# Patient Record
Sex: Male | Born: 1935 | Hispanic: Yes | Smoking: Never smoker
Health system: Southern US, Community
[De-identification: ages and names within clinical notes are randomized; demographics above are authoritative.]

## PROBLEM LIST (undated history)

## (undated) DIAGNOSIS — Z789 Other specified health status: Secondary | ICD-10-CM

## (undated) HISTORY — PX: NO PAST SURGERIES: SHX2092

---

## 2016-10-12 ENCOUNTER — Encounter: Payer: Self-pay | Admitting: Emergency Medicine

## 2016-10-12 ENCOUNTER — Observation Stay
Admission: EM | Admit: 2016-10-12 | Discharge: 2016-10-14 | Disposition: A | Discharge status: Home / Self Care | Payer: Medicare Other | Attending: Internal Medicine | Admitting: Internal Medicine

## 2016-10-12 ENCOUNTER — Emergency Department: Payer: Medicare Other

## 2016-10-12 DIAGNOSIS — E041 Nontoxic single thyroid nodule: Secondary | ICD-10-CM | POA: Diagnosis not present

## 2016-10-12 DIAGNOSIS — R0602 Shortness of breath: Secondary | ICD-10-CM | POA: Diagnosis not present

## 2016-10-12 DIAGNOSIS — J3802 Paralysis of vocal cords and larynx, bilateral: Principal | ICD-10-CM | POA: Insufficient documentation

## 2016-10-12 DIAGNOSIS — R079 Chest pain, unspecified: Secondary | ICD-10-CM | POA: Diagnosis not present

## 2016-10-12 DIAGNOSIS — R05 Cough: Secondary | ICD-10-CM | POA: Diagnosis not present

## 2016-10-12 HISTORY — DX: Other specified health status: Z78.9

## 2016-10-12 LAB — BASIC METABOLIC PANEL
Anion gap: 8 (ref 5–15)
BUN: 18 mg/dL (ref 6–20)
CHLORIDE: 102 mmol/L (ref 101–111)
CO2: 27 mmol/L (ref 22–32)
CREATININE: 1.31 mg/dL — AB (ref 0.61–1.24)
Calcium: 9.2 mg/dL (ref 8.9–10.3)
GFR, EST AFRICAN AMERICAN: 58 mL/min — AB (ref >60)
GFR, EST NON AFRICAN AMERICAN: 50 mL/min — AB (ref >60)
Glucose, Bld: 134 mg/dL — ABNORMAL HIGH (ref 65–99)
Potassium: 4.2 mmol/L (ref 3.5–5.1)
SODIUM: 137 mmol/L (ref 135–145)

## 2016-10-12 LAB — TROPONIN I

## 2016-10-12 LAB — CBC
HCT: 38.9 % — ABNORMAL LOW (ref 40.0–52.0)
Hemoglobin: 13.7 g/dL (ref 13.0–18.0)
MCH: 31.7 pg (ref 26.0–34.0)
MCHC: 35.2 g/dL (ref 32.0–36.0)
MCV: 90.1 fL (ref 80.0–100.0)
PLATELETS: 138 10*3/uL — AB (ref 150–440)
RBC: 4.32 MIL/uL — AB (ref 4.40–5.90)
RDW: 12.9 % (ref 11.5–14.5)
WBC: 6.5 10*3/uL (ref 3.8–10.6)

## 2016-10-12 MED ORDER — IPRATROPIUM-ALBUTEROL 0.5-2.5 (3) MG/3ML IN SOLN
RESPIRATORY_TRACT | Status: AC
  Administered 2016-10-12: 3 mL via RESPIRATORY_TRACT
  Filled 2016-10-12: qty 3

## 2016-10-12 MED ORDER — IPRATROPIUM-ALBUTEROL 0.5-2.5 (3) MG/3ML IN SOLN
3.0000 mL | Freq: Once | RESPIRATORY_TRACT | Status: AC
  Administered 2016-10-12: 3 mL via RESPIRATORY_TRACT

## 2016-10-12 MED ORDER — IPRATROPIUM-ALBUTEROL 0.5-2.5 (3) MG/3ML IN SOLN
3.0000 mL | Freq: Once | RESPIRATORY_TRACT | Status: AC
  Administered 2016-10-12: 3 mL via RESPIRATORY_TRACT
  Filled 2016-10-12: qty 3

## 2016-10-12 MED ORDER — ASPIRIN 81 MG PO CHEW
CHEWABLE_TABLET | ORAL | Status: AC
  Administered 2016-10-12: 324 mg via ORAL
  Filled 2016-10-12: qty 4

## 2016-10-12 MED ORDER — ASPIRIN 81 MG PO CHEW
324.0000 mg | CHEWABLE_TABLET | Freq: Once | ORAL | Status: AC
  Administered 2016-10-12: 324 mg via ORAL

## 2016-10-12 MED ORDER — IOPAMIDOL (ISOVUE-370) INJECTION 76%
75.0000 mL | Freq: Once | INTRAVENOUS | Status: AC | PRN
  Administered 2016-10-12: 75 mL via INTRAVENOUS

## 2016-10-12 NOTE — ED Notes (Signed)
Pt w/ another episode of coughing and stridor, dr. Fanny BienQuale to bedside, sx no subsiding

## 2016-10-12 NOTE — ED Notes (Signed)
Patient transported to X-ray informed XR to place pt back into the lobby. Interpreter accompanied patient to XR.

## 2016-10-12 NOTE — ED Triage Notes (Signed)
Pt presents to ED with grandson, recently relocated from [place]. Pt c/o left sided chest pain that started around 1600.  Pt also having some shortness of breath and dizziness as well. Pt states when the pain started he was lying down in bed watching TV.  Pt reports cough that started yesterday and is non-productive.

## 2016-10-12 NOTE — ED Notes (Signed)
Pt brought to rm 4 via WC from triage, was triaged about an hour ago for CP, now c/o severe SOB, pt w/ stridor upon arrival to room, Dr. Fanny BienQuale at bedside, RA o2sat 99% but placed on 2L [place] for comfort, w/ oxygen, pt breathing improved.  Pt c/o tightness in throat.  Assessment completed by this nurse with limited spanish ability, interpreter paged.  Per family, no medical problems and only medicine is vitamins

## 2016-10-13 ENCOUNTER — Observation Stay: Payer: Medicare Other

## 2016-10-13 ENCOUNTER — Encounter: Payer: Self-pay | Admitting: Internal Medicine

## 2016-10-13 DIAGNOSIS — J385 Laryngeal spasm: Secondary | ICD-10-CM | POA: Diagnosis not present

## 2016-10-13 DIAGNOSIS — J3802 Paralysis of vocal cords and larynx, bilateral: Secondary | ICD-10-CM | POA: Diagnosis not present

## 2016-10-13 DIAGNOSIS — R0602 Shortness of breath: Secondary | ICD-10-CM

## 2016-10-13 DIAGNOSIS — R062 Wheezing: Secondary | ICD-10-CM | POA: Diagnosis not present

## 2016-10-13 DIAGNOSIS — R079 Chest pain, unspecified: Secondary | ICD-10-CM | POA: Diagnosis not present

## 2016-10-13 DIAGNOSIS — R061 Stridor: Secondary | ICD-10-CM | POA: Diagnosis not present

## 2016-10-13 DIAGNOSIS — R930 Abnormal findings on diagnostic imaging of skull and head, not elsewhere classified: Secondary | ICD-10-CM | POA: Diagnosis not present

## 2016-10-13 LAB — BASIC METABOLIC PANEL
ANION GAP: 7 (ref 5–15)
BUN: 15 mg/dL (ref 6–20)
CALCIUM: 8.9 mg/dL (ref 8.9–10.3)
CO2: 25 mmol/L (ref 22–32)
CREATININE: 1.24 mg/dL (ref 0.61–1.24)
Chloride: 107 mmol/L (ref 101–111)
GFR calc Af Amer: >60 mL/min (ref >60)
GFR, EST NON AFRICAN AMERICAN: 53 mL/min — AB (ref >60)
GLUCOSE: 106 mg/dL — AB (ref 65–99)
Potassium: 4.2 mmol/L (ref 3.5–5.1)
Sodium: 139 mmol/L (ref 135–145)

## 2016-10-13 LAB — CBC
HCT: 38 % — ABNORMAL LOW (ref 40.0–52.0)
HEMOGLOBIN: 13.2 g/dL (ref 13.0–18.0)
MCH: 31.4 pg (ref 26.0–34.0)
MCHC: 34.8 g/dL (ref 32.0–36.0)
MCV: 90.2 fL (ref 80.0–100.0)
Platelets: 141 10*3/uL — ABNORMAL LOW (ref 150–440)
RBC: 4.22 MIL/uL — ABNORMAL LOW (ref 4.40–5.90)
RDW: 13 % (ref 11.5–14.5)
WBC: 5.9 10*3/uL (ref 3.8–10.6)

## 2016-10-13 LAB — TROPONIN I
Troponin I: <0.03 ng/mL (ref <0.03)
Troponin I: <0.03 ng/mL (ref <0.03)

## 2016-10-13 LAB — SEDIMENTATION RATE: Sed Rate: 26 mm/hr — ABNORMAL HIGH (ref 0–20)

## 2016-10-13 MED ORDER — GADOBENATE DIMEGLUMINE 529 MG/ML IV SOLN
13.0000 mL | Freq: Once | INTRAVENOUS | Status: AC | PRN
  Administered 2016-10-13: 13 mL via INTRAVENOUS

## 2016-10-13 MED ORDER — ONDANSETRON HCL 4 MG PO TABS: 4.0000 mg | ORAL_TABLET | Freq: Four times a day (QID) | ORAL | Status: DC | PRN

## 2016-10-13 MED ORDER — CEFUROXIME AXETIL 500 MG PO TABS
500.0000 mg | ORAL_TABLET | Freq: Two times a day (BID) | ORAL | Status: DC
  Administered 2016-10-13 – 2016-10-14 (×2): 500 mg via ORAL
  Filled 2016-10-13 (×2): qty 1

## 2016-10-13 MED ORDER — DEXAMETHASONE SODIUM PHOSPHATE 4 MG/ML IJ SOLN
4.0000 mg | Freq: Three times a day (TID) | INTRAMUSCULAR | Status: DC
  Administered 2016-10-13 – 2016-10-14 (×4): 4 mg via INTRAVENOUS
  Filled 2016-10-13 (×4): qty 1

## 2016-10-13 MED ORDER — PANTOPRAZOLE SODIUM 40 MG PO TBEC
40.0000 mg | DELAYED_RELEASE_TABLET | Freq: Two times a day (BID) | ORAL | Status: DC
  Administered 2016-10-13 – 2016-10-14 (×2): 40 mg via ORAL
  Filled 2016-10-13 (×2): qty 1

## 2016-10-13 MED ORDER — IPRATROPIUM-ALBUTEROL 0.5-2.5 (3) MG/3ML IN SOLN
3.0000 mL | RESPIRATORY_TRACT | Status: DC
  Administered 2016-10-13 (×4): 3 mL via RESPIRATORY_TRACT
  Filled 2016-10-13 (×4): qty 3

## 2016-10-13 MED ORDER — IPRATROPIUM-ALBUTEROL 0.5-2.5 (3) MG/3ML IN SOLN: 3.0000 mL | RESPIRATORY_TRACT | Status: DC | PRN

## 2016-10-13 MED ORDER — SODIUM CHLORIDE 0.9 % IV SOLN
INTRAVENOUS | Status: AC
  Administered 2016-10-13: 02:00:00 via INTRAVENOUS

## 2016-10-13 MED ORDER — ACETAMINOPHEN 650 MG RE SUPP: 650.0000 mg | Freq: Four times a day (QID) | RECTAL | Status: DC | PRN

## 2016-10-13 MED ORDER — ASPIRIN 81 MG PO CHEW
81.0000 mg | CHEWABLE_TABLET | Freq: Every day | ORAL | Status: DC
  Administered 2016-10-13 – 2016-10-14 (×2): 81 mg via ORAL
  Filled 2016-10-13 (×2): qty 1

## 2016-10-13 MED ORDER — ACETAMINOPHEN 325 MG PO TABS: 650.0000 mg | ORAL_TABLET | Freq: Four times a day (QID) | ORAL | Status: DC | PRN

## 2016-10-13 MED ORDER — TUBERCULIN PPD 5 UNIT/0.1ML ID SOLN
5.0000 [IU] | Freq: Once | INTRADERMAL | Status: DC
  Administered 2016-10-14: 5 [IU] via INTRADERMAL
  Filled 2016-10-13 (×2): qty 0.1

## 2016-10-13 MED ORDER — ONDANSETRON HCL 4 MG/2ML IJ SOLN: 4.0000 mg | Freq: Four times a day (QID) | INTRAMUSCULAR | Status: DC | PRN

## 2016-10-13 MED ORDER — ENOXAPARIN SODIUM 40 MG/0.4ML ~~LOC~~ SOLN
40.0000 mg | SUBCUTANEOUS | Status: DC
  Administered 2016-10-13: 40 mg via SUBCUTANEOUS
  Filled 2016-10-13: qty 0.4

## 2016-10-13 NOTE — Progress Notes (Signed)
The RN called to the room the second time by the family and noted stridor sound and coughing again.  Also noted thick yellow sputum from the patient . Dr. Sheryle Hailiamond called to come to the bedside and check the patient. MD stated he would be here soon. Reported the  The thick sputum to Dr. Sheryle Hailiamond as well. Will continue to monitor.

## 2016-10-13 NOTE — H&P (Signed)
[facility] [place] at [facility]   PATIENT NAME: Walter Marshall    MR#:  [id number]  DATE OF BIRTH:  10/07/1934  DATE OF ADMISSION:  10/12/2016  PRIMARY CARE PHYSICIAN: No PCP Per Patient   REQUESTING/REFERRING PHYSICIAN: Fanny Bien, MD  CHIEF COMPLAINT:   Chief Complaint  Patient presents with  . Chest Pain    HISTORY OF PRESENT ILLNESS:  Walter Marshall  is a 82 y.o. male who presents with Chest pain. Patient states that he has had several episodes of chest pain today. He describes these as a sudden onset type of pain, he states that he feels like he has a squeezing sensation in his throat which then moves into his chest. He has no prior diagnosed history of medical problems and has not ever followed regularly with a doctor. In the ED tonight he had 2 sets of cardiac enzymes are negative, however he had some flattened T waves in his septal leads on his first EKG which then became significantly inverted his second EKG during one of these episodes. Hospitalists were called for admission and further evaluation.  PAST MEDICAL HISTORY:   Past Medical History:  Diagnosis Date  . Patient denies medical problems     PAST SURGICAL HISTORY:   Past Surgical History:  Procedure Laterality Date  . NO PAST SURGERIES      SOCIAL HISTORY:   Social History  Substance Use Topics  . Smoking status: Never Smoker  . Smokeless tobacco: Never Used  . Alcohol use Yes    FAMILY HISTORY:   Family History  Problem Relation Age of Onset  . Family history unknown: Yes    DRUG ALLERGIES:  No Known Allergies  MEDICATIONS AT HOME:   Prior to Admission medications   Not on File    REVIEW OF SYSTEMS:  Review of Systems  Constitutional: Negative for chills, fever, malaise/fatigue and weight loss.  HENT: Negative for ear pain, hearing loss and tinnitus.   Eyes: Negative for blurred vision, double vision, pain and redness.  Respiratory: Positive  for cough. Negative for hemoptysis and shortness of breath.   Cardiovascular: Positive for chest pain. Negative for palpitations, orthopnea and leg swelling.  Gastrointestinal: Negative for abdominal pain, constipation, diarrhea, nausea and vomiting.  Genitourinary: Negative for dysuria, frequency and hematuria.  Musculoskeletal: Negative for back pain, joint pain and neck pain.  Skin:       No acne, rash, or lesions  Neurological: Negative for dizziness, tremors, focal weakness and weakness.  Endo/Heme/Allergies: Negative for polydipsia. Does not bruise/bleed easily.  Psychiatric/Behavioral: Negative for depression. The patient is not nervous/anxious and does not have insomnia.      VITAL SIGNS:   Vitals:   10/12/16 2230 10/12/16 2300 10/12/16 2330 10/13/16 0000  BP: 121/73 (!) 151/66 (!) 162/76 136/78  Pulse: 79 89 (!) 105 98  Resp: 20 19 15 13   Temp:      TempSrc:      SpO2: 97% 98% 96% 95%  Weight:      Height:       Wt Readings from Last 3 Encounters:  10/12/16 63.5 kg (140 lb)    PHYSICAL EXAMINATION:  Physical Exam  Vitals reviewed. Constitutional: He is oriented to person, place, and time. He appears well-developed and well-nourished. No distress.  HENT:  Head: Normocephalic and atraumatic.  Mouth/Throat: Oropharynx is clear and moist.  Eyes: Conjunctivae and EOM are normal. Pupils are equal, round, and reactive to light. No scleral icterus.  Neck: Normal range of motion. Neck supple. No JVD present. No thyromegaly present.  Cardiovascular: Normal rate, regular rhythm and intact distal pulses.  Exam reveals no gallop and no friction rub.   No murmur heard. Respiratory: Effort normal and breath sounds normal. No respiratory distress. He has no wheezes. He has no rales.  GI: Soft. Bowel sounds are normal. He exhibits no distension. There is no tenderness.  Musculoskeletal: Normal range of motion. He exhibits no edema.  No arthritis, no gout  Lymphadenopathy:    He  has no cervical adenopathy.  Neurological: He is alert and oriented to person, place, and time. No cranial nerve deficit.  No dysarthria, no aphasia  Skin: Skin is warm and dry. No rash noted. No erythema.  Psychiatric: He has a normal mood and affect. His behavior is normal. Judgment and thought content normal.    LABORATORY PANEL:   CBC  Recent Labs Lab 10/12/16 1820  WBC 6.5  HGB 13.7  HCT 38.9*  PLT 138*   ------------------------------------------------------------------------------------------------------------------  Chemistries   Recent Labs Lab 10/12/16 1820  NA 137  K 4.2  CL 102  CO2 27  GLUCOSE 134*  BUN 18  CREATININE 1.31*  CALCIUM 9.2   ------------------------------------------------------------------------------------------------------------------  Cardiac Enzymes  Recent Labs Lab 10/12/16 2228  TROPONINI <0.03   ------------------------------------------------------------------------------------------------------------------  RADIOLOGY:  Dg Chest 2 View  Result Date: 10/12/2016 CLINICAL DATA:  Chest pain, shortness of breath and cough today EXAM: CHEST  2 VIEW COMPARISON:  None FINDINGS: Mild enlargement of cardiac silhouette. Mediastinal contours and pulmonary vascularity normal. Lungs clear. No pleural effusion or pneumothorax. Scattered endplate spur formation thoracic spine. IMPRESSION: No acute abnormalities. Mild enlargement of cardiac silhouette. Electronically Signed   By: Ulyses SouthwardMark  Boles M.D.   On: 10/12/2016 18:52   Ct Angio Chest Pe W Or Wo Contrast  Result Date: 10/12/2016 CLINICAL DATA:  Left-sided chest pain for several hours EXAM: CT ANGIOGRAPHY CHEST WITH CONTRAST TECHNIQUE: Multidetector CT imaging of the chest was performed using the standard protocol during bolus administration of intravenous contrast. Multiplanar CT image reconstructions and MIPs were obtained to evaluate the vascular anatomy. CONTRAST:  75 mL Isovue 370.  COMPARISON:  None. FINDINGS: Cardiovascular: The thoracic aorta shows no significant aneurysmal dilatation or dissection. Cardiac structures are within normal limits. Pulmonary artery demonstrates a normal branching pattern. No filling defects are identified to suggest pulmonary emboli. Mediastinum/Nodes: The thoracic inlet shows evidence of a large 3.2 cm hypodense nodule within the left lobe of the thyroid extending into the superior mediastinum. No significant lymphadenopathy is identified. Some scattered calcified lymph nodes are noted within the hila consistent with prior granulomatous disease. Lungs/Pleura: Lungs are well aerated bilaterally. Scattered calcifications are seen. This is consistent with prior granulomatous disease. No focal infiltrate or sizable effusion is seen. Upper Abdomen: No acute abnormality. Musculoskeletal: Degenerative change of the thoracic spine is noted. Review of the MIP images confirms the above findings. IMPRESSION: No evidence of pulmonary emboli. Changes of prior granulomatous disease. 3.2 cm left thyroid nodule. The need for further evaluation by a nonemergent ultrasound can be determined on a clinical basis. Electronically Signed   By: Alcide CleverMark  Lukens M.D.   On: 10/12/2016 20:07    EKG:   Orders placed or performed during the hospital encounter of 10/12/16  . EKG 12-Lead  . EKG 12-Lead  . ED EKG within 10 minutes  . ED EKG within 10 minutes  . ED EKG  . ED EKG  . EKG 12-Lead  .  EKG 12-Lead    IMPRESSION AND PLAN:  Principal Problem:   Chest pain - unclear etiology, troponins negative so far but he did have some EKG changes from his first to second EKG. We will finish trending his enzymes tonight, get an echocardiogram and cardiology consult.  All the records are reviewed and case discussed with ED provider. Management plans discussed with the patient and/or family.  DVT PROPHYLAXIS: SubQ lovenox  GI PROPHYLAXIS: None  ADMISSION STATUS:  Observation  CODE STATUS: Full Code Status History    This patient does not have a recorded code status. Please follow your organizational policy for patients in this situation.      TOTAL TIME TAKING CARE OF THIS PATIENT: 40 minutes.    Jazzy Parmer FIELDING 10/13/2016, 12:33 AM  [facility]  Office  [phone]  CC: Primary care physician; No PCP Per Patient

## 2016-10-13 NOTE — Progress Notes (Signed)
[facility] - [place] at [facility]   PATIENT NAME: Walter Marshall    MR#:  [id number]  DATE OF BIRTH:  [birth year]  SUBJECTIVE:   Patient here with chest pain however says he has had episodes of choking sensation and breathing issues with this with notable stridor that then gives him chest pain  REVIEW OF SYSTEMS:    Review of Systems  Constitutional: Negative.  Negative for chills, fever and malaise/fatigue.  HENT: Negative.  Negative for ear discharge, ear pain, hearing loss, nosebleeds and sore throat.        Stridor episodes 4 since admission   Eyes: Negative.  Negative for blurred vision and pain.  Respiratory: Negative.  Negative for cough, hemoptysis, shortness of breath and wheezing.   Cardiovascular: Positive for chest pain (with strodir and difficulty breathing). Negative for palpitations and leg swelling.  Gastrointestinal: Negative.  Negative for abdominal pain, blood in stool, diarrhea, nausea and vomiting.  Genitourinary: Negative.  Negative for dysuria.  Musculoskeletal: Negative.  Negative for back pain.  Skin: Negative.   Neurological: Negative for dizziness, tremors, speech change, focal weakness, seizures and headaches.  Endo/Heme/Allergies: Negative.  Does not bruise/bleed easily.  Psychiatric/Behavioral: Negative.  Negative for depression, hallucinations and suicidal ideas.    Tolerating Diet: NPO      DRUG ALLERGIES:  No Known Allergies  VITALS:  Blood pressure 107/68, pulse 92, temperature 97.9 F (36.6 C), temperature source Oral, resp. rate 18, height 5' (1.524 m), weight 67.8 kg (149 lb 8 oz), SpO2 98 %.  PHYSICAL EXAMINATION:   Physical Exam  Constitutional: He is oriented to person, place, and time and well-developed, well-nourished, and in no distress. No distress.  HENT:  Head: Normocephalic.  Eyes: No scleral icterus.  Neck: Normal range of motion. Neck supple. No JVD present. No tracheal deviation present.   Cardiovascular: Normal rate, regular rhythm and normal heart sounds.  Exam reveals no gallop and no friction rub.   No murmur heard. Pulmonary/Chest: Effort normal and breath sounds normal. No respiratory distress. He has no wheezes. He has no rales. He exhibits no tenderness.  Abdominal: Soft. Bowel sounds are normal. He exhibits no distension and no mass. There is no tenderness. There is no rebound and no guarding.  Musculoskeletal: Normal range of motion. He exhibits no edema.  Neurological: He is alert and oriented to person, place, and time.  Skin: Skin is warm. No rash noted. No erythema.  Psychiatric: Affect and judgment normal.      LABORATORY PANEL:   CBC  Recent Labs Lab 10/13/16 0445  WBC 5.9  HGB 13.2  HCT 38.0*  PLT 141*   ------------------------------------------------------------------------------------------------------------------  Chemistries   Recent Labs Lab 10/13/16 0445  NA 139  K 4.2  CL 107  CO2 25  GLUCOSE 106*  BUN 15  CREATININE 1.24  CALCIUM 8.9   ------------------------------------------------------------------------------------------------------------------  Cardiac Enzymes  Recent Labs Lab 10/12/16 1820 10/12/16 2228 10/13/16 0445  TROPONINI <0.03 <0.03 <0.03   ------------------------------------------------------------------------------------------------------------------  RADIOLOGY:  Dg Chest 2 View  Result Date: 10/12/2016 CLINICAL DATA:  Chest pain, shortness of breath and cough today EXAM: CHEST  2 VIEW COMPARISON:  None FINDINGS: Mild enlargement of cardiac silhouette. Mediastinal contours and pulmonary vascularity normal. Lungs clear. No pleural effusion or pneumothorax. Scattered endplate spur formation thoracic spine. IMPRESSION: No acute abnormalities. Mild enlargement of cardiac silhouette. Electronically Signed   By: Ulyses SouthwardMark  Boles M.D.   On: 10/12/2016 18:52   Ct Angio Chest Pe W Or  Wo Contrast  Result Date:  10/12/2016 CLINICAL DATA:  Left-sided chest pain for several hours EXAM: CT ANGIOGRAPHY CHEST WITH CONTRAST TECHNIQUE: Multidetector CT imaging of the chest was performed using the standard protocol during bolus administration of intravenous contrast. Multiplanar CT image reconstructions and MIPs were obtained to evaluate the vascular anatomy. CONTRAST:  75 mL Isovue 370. COMPARISON:  None. FINDINGS: Cardiovascular: The thoracic aorta shows no significant aneurysmal dilatation or dissection. Cardiac structures are within normal limits. Pulmonary artery demonstrates a normal branching pattern. No filling defects are identified to suggest pulmonary emboli. Mediastinum/Nodes: The thoracic inlet shows evidence of a large 3.2 cm hypodense nodule within the left lobe of the thyroid extending into the superior mediastinum. No significant lymphadenopathy is identified. Some scattered calcified lymph nodes are noted within the hila consistent with prior granulomatous disease. Lungs/Pleura: Lungs are well aerated bilaterally. Scattered calcifications are seen. This is consistent with prior granulomatous disease. No focal infiltrate or sizable effusion is seen. Upper Abdomen: No acute abnormality. Musculoskeletal: Degenerative change of the thoracic spine is noted. Review of the MIP images confirms the above findings. IMPRESSION: No evidence of pulmonary emboli. Changes of prior granulomatous disease. 3.2 cm left thyroid nodule. The need for further evaluation by a nonemergent ultrasound can be determined on a clinical basis. Electronically Signed   By: Alcide CleverMark  Lukens M.D.   On: 10/12/2016 20:07     ASSESSMENT AND PLAN:   82 y/o male with no PMHX here with atypical chest pain and stridor.  1. Chest pain and ruled for for XBJ:YNWGACS:This happens with stridor/wheezing. ENT evaluation in addition to cardiology Consider outpatient stress test with abnormal EKG during episode CT shows no PE  2. Stridor: ENT evaluation  3.3.2  cm left nodule, thyroid: Outpatient follow up with ENDOCRINE   Management plans discussed with the patient and he is in agreement.  CODE STATUS: full  TOTAL TIME TAKING CARE OF THIS PATIENT: 33 minutes.   Rounded with nursing  POSSIBLE D/C 1-2 days, DEPENDING ON CLINICAL CONDITION.   Mayrene Bastarache M.D on 10/13/2016 at 7:36 AM  Between 7am to 6pm - Pager - [phone] After 6pm go to [email or web address] - Social research officer, [email or web address] [email or web address]  [facility]  Office  [phone]  CC: Primary care physician; No PCP Per Patient  Note: This dictation was prepared with Dragon dictation along with smaller phrase technology. Any transcriptional errors that result from this process are unintentional.

## 2016-10-13 NOTE — Consult Note (Addendum)
Walter Marshall, Walter Marshall [id number] 10-21-1934 Riley Nearing, MD  Reason for Consult: Stridor Requesting Physician: Bettey Costa, MD Consulting Physician: Riley Nearing  HPI: This 82 y.o. year old male was admitted on 10/12/2016 for Chest pain [R07.9] Moderate risk chest pain [R07.9]. This patient was admitted to the hospital yesterday after an episode of chest pain and tightness in his throat with difficulty breathing. He has not had issues like this previously. He has had some sore throat since this started but no fever and denies any history of reflux or difficulty swallowing. He has had some hoarseness since this started. There is no history of prior intubation reported and he is otherwise reported as healthy with no known medical history. He is a nonsmoker though he admits to heavy alcohol use in the past, but does not drink at this point. He does say that he coughed up some thick mucus with some blood tinge to it. CT scan of the chest showed a 3.2 cm left thyroid nodule. The patient was unaware of any thyroid problems. He also had some small calcified nodes in the hilum suggestive of prior granulomatous disease. Reviewing the scan I also note that the true vocal cords appear to be near midline.  Allergies: No Known Allergies  Medications:  No prescriptions prior to admission.  .  Current Facility-Administered Medications  Medication Dose Route Frequency Provider Last Rate Last Dose  . acetaminophen (TYLENOL) tablet 650 mg  650 mg Oral Q6H PRN Lance Coon, MD       Or  . acetaminophen (TYLENOL) suppository 650 mg  650 mg Rectal Q6H PRN Lance Coon, MD      . aspirin chewable tablet 81 mg  81 mg Oral Daily Bettey Costa, MD   81 mg at 10/13/16 1037  . enoxaparin (LOVENOX) injection 40 mg  40 mg Subcutaneous Q24H Lance Coon, MD      . ipratropium-albuterol (DUONEB) 0.5-2.5 (3) MG/3ML nebulizer solution 3 mL  3 mL Nebulization Q4H Harrie Foreman, MD   3 mL at 10/13/16 1107  . ondansetron  (ZOFRAN) tablet 4 mg  4 mg Oral Q6H PRN Lance Coon, MD       Or  . ondansetron [facility]) injection 4 mg  4 mg Intravenous Q6H PRN Lance Coon, MD        PMH:  Past Medical History:  Diagnosis Date  . Patient denies medical problems     Fam Hx:  Family History  Problem Relation Age of Onset  . Family history unknown: Yes    Soc Hx:  Social History   Social History  . Marital status: Widowed    Spouse name: N/A  . Number of children: N/A  . Years of education: N/A   Occupational History  . Not on file.   Social History Main Topics  . Smoking status: Never Smoker  . Smokeless tobacco: Never Used  . Alcohol use Yes  . Drug use: No  . Sexual activity: Not on file   Other Topics Concern  . Not on file   Social History Narrative  . No narrative on file    PSH:  Past Surgical History:  Procedure Laterality Date  . NO PAST SURGERIES    . Procedures since admission: No admission procedures for hospital encounter.  ROS: Review of systems normal other than 12 systems except per HPI.  PHYSICAL EXAM Vitals:  Vitals:   10/13/16 0847 10/13/16 1223  BP: 139/68 (!) 115/59  Pulse: 100 93  Resp:  Temp: 98.7 F (37.1 C) 98.9 F (37.2 C)  . General: Well-developed, Well-nourished in no acute distress Mood: Mood and affect well adjusted, pleasant and cooperative. Orientation: Grossly alert and oriented. Vocal Quality: No hoarseness. Communicates verbally.Communicating through interpreter. head and Face: NCAT. No facial asymmetry. No visible skin lesions. No significant facial scars. No tenderness with sinus percussion. Facial strength normal and symmetric. Ears: External ears with normal landmarks, no lesions. External auditory canals free of infection, cerumen impaction or lesions. Tympanic membranes intact with good landmarks and normal mobility on pneumatic otoscopy. No middle ear effusion. Hearing: Speech reception grossly normal. Nose: External nose normal with  midline dorsum and no lesions or deformity. Nasal Cavity reveals essentially midline septum with normal inferior turbinates. No significant mucosal congestion or erythema. Nasal secretions are minimal and clear. No polyps seen on anterior rhinoscopy. Oral Cavity/ Oropharynx: Lips are normal with no lesions. Gingiva healthy with no lesions or gingivitis. Oropharynx including tongue, buccal mucosa, floor of mouth, hard and soft palate, uvula and posterior pharynx free of exudates, erythema or lesions with normal symmetry and hydration.  Indirect Laryngoscopy/Nasopharyngoscopy: Visualization of the larynx, hypopharynx and nasopharynx is not possible in this setting with routine examination. Neck: Supple and symmetric with no palpable masses, tenderness or crepitance. The trachea is midline. Thyroid gland free of palpable masses. I cannot palpate the left thyroid nodule. Parotid and submandibular glands are soft, nontender and symmetric, without masses. Lymphatic: Cervical lymph nodes are without palpable lymphadenopathy or tenderness. Respiratory: Normal respiratory effort without labored breathing. No stridor currently. Cardiovascular: Carotid pulse shows regular rate and rhythm Neurologic: Cranial Nerves II through XII are grossly intact. Eyes: Gaze and Ocular Motility are grossly normal. PERRLA. No visible nystagmus.  MEDICAL DECISION MAKING: Data Review:  Results for orders placed or performed during the hospital encounter of 10/12/16 (from the past 48 hour(s))  Basic metabolic panel     Status: Abnormal   Collection Time: 10/12/16  6:20 PM  Result Value Ref Range   Sodium 137 135 - 145 mmol/L   Potassium 4.2 3.5 - 5.1 mmol/L   Chloride 102 101 - 111 mmol/L   CO2 27 22 - 32 mmol/L   Glucose, Bld 134 (H) 65 - 99 mg/dL   BUN 18 6 - 20 mg/dL   Creatinine, Ser 1.31 (H) 0.61 - 1.24 mg/dL   Calcium 9.2 8.9 - 10.3 mg/dL   GFR calc non Af Amer 50 (L) >60 mL/min   GFR calc Af Amer 58 (L) >60 mL/min     Comment: (NOTE) The eGFR has been calculated using the CKD EPI equation. This calculation has not been validated in all clinical situations. eGFR's persistently <60 mL/min signify possible Chronic Kidney Disease.    Anion gap 8 5 - 15  CBC     Status: Abnormal   Collection Time: 10/12/16  6:20 PM  Result Value Ref Range   WBC 6.5 3.8 - 10.6 K/uL   RBC 4.32 (L) 4.40 - 5.90 MIL/uL   Hemoglobin 13.7 13.0 - 18.0 g/dL   HCT 38.9 (L) 40.0 - 52.0 %   MCV 90.1 80.0 - 100.0 fL   MCH 31.7 26.0 - 34.0 pg   MCHC 35.2 32.0 - 36.0 g/dL   RDW 12.9 11.5 - 14.5 %   Platelets 138 (L) 150 - 440 K/uL  Troponin I     Status: None   Collection Time: 10/12/16  6:20 PM  Result Value Ref Range   Troponin I <0.03 <0.03 ng/mL  Troponin I     Status: None   Collection Time: 10/12/16 10:28 PM  Result Value Ref Range   Troponin I <0.03 <0.03 ng/mL  Basic metabolic panel     Status: Abnormal   Collection Time: 10/13/16  4:45 AM  Result Value Ref Range   Sodium 139 135 - 145 mmol/L   Potassium 4.2 3.5 - 5.1 mmol/L   Chloride 107 101 - 111 mmol/L   CO2 25 22 - 32 mmol/L   Glucose, Bld 106 (H) 65 - 99 mg/dL   BUN 15 6 - 20 mg/dL   Creatinine, Ser 1.24 0.61 - 1.24 mg/dL   Calcium 8.9 8.9 - 10.3 mg/dL   GFR calc non Af Amer 53 (L) >60 mL/min   GFR calc Af Amer >60 >60 mL/min    Comment: (NOTE) The eGFR has been calculated using the CKD EPI equation. This calculation has not been validated in all clinical situations. eGFR's persistently <60 mL/min signify possible Chronic Kidney Disease.    Anion gap 7 5 - 15  CBC     Status: Abnormal   Collection Time: 10/13/16  4:45 AM  Result Value Ref Range   WBC 5.9 3.8 - 10.6 K/uL   RBC 4.22 (L) 4.40 - 5.90 MIL/uL   Hemoglobin 13.2 13.0 - 18.0 g/dL   HCT 38.0 (L) 40.0 - 52.0 %   MCV 90.2 80.0 - 100.0 fL   MCH 31.4 26.0 - 34.0 pg   MCHC 34.8 32.0 - 36.0 g/dL   RDW 13.0 11.5 - 14.5 %   Platelets 141 (L) 150 - 440 K/uL  Troponin I     Status: None    Collection Time: 10/13/16  4:45 AM  Result Value Ref Range   Troponin I <0.03 <0.03 ng/mL  Troponin I     Status: None   Collection Time: 10/13/16 10:13 AM  Result Value Ref Range   Troponin I <0.03 <0.03 ng/mL  . Dg Chest 2 View  Result Date: 10/12/2016 CLINICAL DATA:  Chest pain, shortness of breath and cough today EXAM: CHEST  2 VIEW COMPARISON:  None FINDINGS: Mild enlargement of cardiac silhouette. Mediastinal contours and pulmonary vascularity normal. Lungs clear. No pleural effusion or pneumothorax. Scattered endplate spur formation thoracic spine. IMPRESSION: No acute abnormalities. Mild enlargement of cardiac silhouette. Electronically Signed   By: Lavonia Dana M.D.   On: 10/12/2016 18:52   Ct Angio Chest Pe W Or Wo Contrast  Result Date: 10/12/2016 CLINICAL DATA:  Left-sided chest pain for several hours EXAM: CT ANGIOGRAPHY CHEST WITH CONTRAST TECHNIQUE: Multidetector CT imaging of the chest was performed using the standard protocol during bolus administration of intravenous contrast. Multiplanar CT image reconstructions and MIPs were obtained to evaluate the vascular anatomy. CONTRAST:  75 mL Isovue 370. COMPARISON:  None. FINDINGS: Cardiovascular: The thoracic aorta shows no significant aneurysmal dilatation or dissection. Cardiac structures are within normal limits. Pulmonary artery demonstrates a normal branching pattern. No filling defects are identified to suggest pulmonary emboli. Mediastinum/Nodes: The thoracic inlet shows evidence of a large 3.2 cm hypodense nodule within the left lobe of the thyroid extending into the superior mediastinum. No significant lymphadenopathy is identified. Some scattered calcified lymph nodes are noted within the hila consistent with prior granulomatous disease. Lungs/Pleura: Lungs are well aerated bilaterally. Scattered calcifications are seen. This is consistent with prior granulomatous disease. No focal infiltrate or sizable effusion is seen. Upper  Abdomen: No acute abnormality. Musculoskeletal: Degenerative change of the thoracic spine is noted.  Review of the MIP images confirms the above findings. IMPRESSION: No evidence of pulmonary emboli. Changes of prior granulomatous disease. 3.2 cm left thyroid nodule. The need for further evaluation by a nonemergent ultrasound can be determined on a clinical basis. Electronically Signed   By: Inez Catalina M.D.   On: 10/12/2016 20:07  .   PROCEDURE: Procedure: Diagnostic Fiberoptic Nasolaryngoscopy Diagnosis: Recent stridor. Bilateral true vocal cord paresis Indications: Patient with recent stridor Findings: The vocal cords are mildly erythematous with some edema and showed no significant mobility. They are in the paramedian position. I cannot appreciate any subglottic mass, the visualization is limited. There are no masses or mucosal lesions of the larynx itself. There were some thick yellow secretions noted in the posterior nasal cavity, which could suggest mild sinusitis Description of Procedure: After discussing procedure and risks  (primarily nose bleed) with the patient, the nose was anesthetized with topical Lidocaine 4% and decongested with phenylephrine. A flexible fiberoptic scope was passed through the nasal cavity. The nasal cavity was inspected and the scope passed through the Nasopharynx to the region of the hypopharynx and larynx. The patient was instructed to phonate to assess vocal cord mobility. The tongue was extended to evaluate the tongue base completely. Valsalva was performed to insufflate the hypopharynx for improved examination. Findings are as noted above. The scope was withdrawn. The patient tolerated the procedure well.  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 He does have a left thyroid nodule but this would not explain paresis of the right vocal cord.  PLAN: Would recommend an MRI of the brain with contrast to rule out central  causes of bilateral vocal cord paresis. Serology should be obtained to evaluate for sarcoidosis, Wegener's granulomatosis, rheumatoid arthritis and lupus. Given the possibility of granulomatous change noted in the hilar nodes, it would also be reasonable to place a PPD to rule out tubercular disease. Some of the edema noted could be secondary to reflux, so would maximize coverage for this. He also does need an ultrasound guided FNA of the left thyroid nodule, and thyroid functions assessed. Since he has had some colored sputum, coverage for sinusitis is reasonable given the findings on nasal endoscopy.  The referral mentioned ruling out Zenker's diverticulum. The patient has not had any ongoing swallowing difficulties. Barium swallow could be considered but clearly the difficulty breathing he has had is due to vocal cord paralysis.  Currently the patient is breathing comfortably at rest despite mild vocal cord edema and bilateral cord paresis. If his condition were to deteriorate he could be intubated. Long-term, bilateral cord paralysis, if unlikely to recover, can be managed with surgical arytenoidectomy on one side that this compromises vocal quality. If this were considered I would refer him to [facility] for such a procedure. Tracheostomy can be necessary in cases where patient's have ongoing difficulties breathing, however he appears to be breathing comfortably currently in this would not appear to be necessary, especially if we reduce some of the mild edema noted with steroids and reflux coverage.   Riley Nearing, MD 10/13/2016 1:42 PM

## 2016-10-13 NOTE — Care Management Obs Status (Signed)
MEDICARE OBSERVATION STATUS NOTIFICATION   Patient Details  Name: Walter Marshall MRN: [id number] Date of Birth: 08/22/1934   Medicare Observation Status Notification Given:  Yes    Theoren Palka, Derrill MemoGenese M, RN 10/13/2016, 7:25 PM

## 2016-10-13 NOTE — Progress Notes (Signed)
Dr. Sheryle Hailiamond saw patient at bedside and stated he was going to order laryngoscopy. Dr. Sheryle Hailiamond spoke Spanish to the patient and he understood everything. Patient indicated whenever those episodes happen, it cut his airway or closes it.

## 2016-10-13 NOTE — Consult Note (Signed)
Primary Physician: Primary Cardiologist:  Asked to see re CP    HPI: Pt is an 82 yo who presented with CP  CP started yesterdaiy  Sudden squeezing Talking to patient and family he was ok until Thursday  He was in kitchen when he developed sudden SOB  Couldn't get breath in or out  [place] outside  Eased.  He had a few other episodes since  One family member witnessed said it looked like he was choking , like had to perform heimlich  Couldn't get breath in or out. He has noticed some cough with sputum production over the past few days  Sl bloody  Prior to Thursday felt good  No CP  No SOB  Actvie         Past Medical History:  Diagnosis Date  . Patient denies medical problems     No prescriptions prior to admission.     Marland Kitchen aspirin  81 mg Oral Daily  . enoxaparin (LOVENOX) injection  40 mg Subcutaneous Q24H  . ipratropium-albuterol  3 mL Nebulization Q4H    Infusions: . sodium chloride 75 mL/hr at 10/13/16 0224    No Known Allergies  Social History   Social History  . Marital status: Widowed    Spouse name: N/A  . Number of children: N/A  . Years of education: N/A   Occupational History  . Not on file.   Social History Main Topics  . Smoking status: Never Smoker  . Smokeless tobacco: Never Used  . Alcohol use Yes  . Drug use: No  . Sexual activity: Not on file   Other Topics Concern  . Not on file   Social History Narrative  . No narrative on file    Family History  Problem Relation Age of Onset  . Family history unknown: Yes    REVIEW OF SYSTEMS:  All systems reviewed  Negative to the above problem except as noted above.    PHYSICAL EXAM: Vitals:   10/13/16 0605 10/13/16 0847  BP: 107/68 139/68  Pulse: 92 100  Resp: 18   Temp: 97.9 F (36.6 C) 98.7 F (37.1 C)     Intake/Output Summary (Last 24 hours) at 10/13/16 0912 Last data filed at 10/13/16 0657  Gross per 24 hour  Intake           341.25 ml  Output              650 ml  Net           -308.75 ml    General:  Well appearing. No respiratory difficulty HEENT: normal Neck: supple. no JVD. Carotids 2+ bilat; no bruits. No lymphadenopathy or thryomegaly appreciated. Cor: PMI nondisplaced. Regular rate & rhythm. No rubs, gallops or murmurs. Lungs: Rhonchi R Lung Abdomen: soft, nontender, nondistended. No hepatosplenomegaly. No bruits or masses. Good bowel sounds. Extremities: no cyanosis, clubbing, rash, edema Neuro: alert & oriented x 3, cranial nerves grossly intact. moves all 4 extremities w/o difficulty. Affect pleasant.  ECG:  NSR  Nonspecific ST changes  Results for orders placed or performed during the hospital encounter of 10/12/16 (from the past 24 hour(s))  Basic metabolic panel     Status: Abnormal   Collection Time: 10/12/16  6:20 PM  Result Value Ref Range   Sodium 137 135 - 145 mmol/L   Potassium 4.2 3.5 - 5.1 mmol/L   Chloride 102 101 - 111 mmol/L   CO2 27 22 - 32 mmol/L   Glucose,  Bld 134 (H) 65 - 99 mg/dL   BUN 18 6 - 20 mg/dL   Creatinine, Ser 4.541.31 (H) 0.61 - 1.24 mg/dL   Calcium 9.2 8.9 - 09.810.3 mg/dL   GFR calc non Af Amer 50 (L) >60 mL/min   GFR calc Af Amer 58 (L) >60 mL/min   Anion gap 8 5 - 15  CBC     Status: Abnormal   Collection Time: 10/12/16  6:20 PM  Result Value Ref Range   WBC 6.5 3.8 - 10.6 K/uL   RBC 4.32 (L) 4.40 - 5.90 MIL/uL   Hemoglobin 13.7 13.0 - 18.0 g/dL   HCT 11.938.9 (L) 14.740.0 - 82.952.0 %   MCV 90.1 80.0 - 100.0 fL   MCH 31.7 26.0 - 34.0 pg   MCHC 35.2 32.0 - 36.0 g/dL   RDW 56.212.9 13.011.5 - 86.514.5 %   Platelets 138 (L) 150 - 440 K/uL  Troponin I     Status: None   Collection Time: 10/12/16  6:20 PM  Result Value Ref Range   Troponin I <0.03 <0.03 ng/mL  Troponin I     Status: None   Collection Time: 10/12/16 10:28 PM  Result Value Ref Range   Troponin I <0.03 <0.03 ng/mL  Basic metabolic panel     Status: Abnormal   Collection Time: 10/13/16  4:45 AM  Result Value Ref Range   Sodium 139 135 - 145 mmol/L   Potassium  4.2 3.5 - 5.1 mmol/L   Chloride 107 101 - 111 mmol/L   CO2 25 22 - 32 mmol/L   Glucose, Bld 106 (H) 65 - 99 mg/dL   BUN 15 6 - 20 mg/dL   Creatinine, Ser 7.841.24 0.61 - 1.24 mg/dL   Calcium 8.9 8.9 - 69.610.3 mg/dL   GFR calc non Af Amer 53 (L) >60 mL/min   GFR calc Af Amer >60 >60 mL/min   Anion gap 7 5 - 15  CBC     Status: Abnormal   Collection Time: 10/13/16  4:45 AM  Result Value Ref Range   WBC 5.9 3.8 - 10.6 K/uL   RBC 4.22 (L) 4.40 - 5.90 MIL/uL   Hemoglobin 13.2 13.0 - 18.0 g/dL   HCT 29.538.0 (L) 28.440.0 - 13.252.0 %   MCV 90.2 80.0 - 100.0 fL   MCH 31.4 26.0 - 34.0 pg   MCHC 34.8 32.0 - 36.0 g/dL   RDW 44.013.0 10.211.5 - 72.514.5 %   Platelets 141 (L) 150 - 440 K/uL  Troponin I     Status: None   Collection Time: 10/13/16  4:45 AM  Result Value Ref Range   Troponin I <0.03 <0.03 ng/mL   Dg Chest 2 View  Result Date: 10/12/2016 CLINICAL DATA:  Chest pain, shortness of breath and cough today EXAM: CHEST  2 VIEW COMPARISON:  None FINDINGS: Mild enlargement of cardiac silhouette. Mediastinal contours and pulmonary vascularity normal. Lungs clear. No pleural effusion or pneumothorax. Scattered endplate spur formation thoracic spine. IMPRESSION: No acute abnormalities. Mild enlargement of cardiac silhouette. Electronically Signed   By: Ulyses SouthwardMark  Boles M.D.   On: 10/12/2016 18:52   Ct Angio Chest Pe W Or Wo Contrast  Result Date: 10/12/2016 CLINICAL DATA:  Left-sided chest pain for several hours EXAM: CT ANGIOGRAPHY CHEST WITH CONTRAST TECHNIQUE: Multidetector CT imaging of the chest was performed using the standard protocol during bolus administration of intravenous contrast. Multiplanar CT image reconstructions and MIPs were obtained to evaluate the vascular anatomy. CONTRAST:  75  mL Isovue 370. COMPARISON:  None. FINDINGS: Cardiovascular: The thoracic aorta shows no significant aneurysmal dilatation or dissection. Cardiac structures are within normal limits. Pulmonary artery demonstrates a normal branching  pattern. No filling defects are identified to suggest pulmonary emboli. Mediastinum/Nodes: The thoracic inlet shows evidence of a large 3.2 cm hypodense nodule within the left lobe of the thyroid extending into the superior mediastinum. No significant lymphadenopathy is identified. Some scattered calcified lymph nodes are noted within the hila consistent with prior granulomatous disease. Lungs/Pleura: Lungs are well aerated bilaterally. Scattered calcifications are seen. This is consistent with prior granulomatous disease. No focal infiltrate or sizable effusion is seen. Upper Abdomen: No acute abnormality. Musculoskeletal: Degenerative change of the thoracic spine is noted. Review of the MIP images confirms the above findings. IMPRESSION: No evidence of pulmonary emboli. Changes of prior granulomatous disease. 3.2 cm left thyroid nodule. The need for further evaluation by a nonemergent ultrasound can be determined on a clinical basis. Electronically Signed   By: Alcide CleverMark  Lukens M.D.   On: 10/12/2016 20:07     ASSESSMENT: PT is an 82 yo with history of dyspnea, SOB  I am not convinced it is cardiac in origin  History is somewhat difficult but family witnessed spells  Also, nursuing notes say pt had spell where he was stridorous  He does have some rhonchi on exam  ? Whether this is causing some acute bronchospams.  Again, does not sound like PND like spells from heart.  I would Rx for pullm  Consider bronchodilators / steroids inhaled. Agree with echo   Will continue to follow   I would not schedule furhter cardiac testing for now.

## 2016-10-13 NOTE — Progress Notes (Signed)
ENT consultation done for stridors and laryngoscopy done today,MRI brain done as recommended and awaiting results.

## 2016-10-13 NOTE — ED Provider Notes (Signed)
[facility] Emergency Department Provider Note   ____________________________________________   First MD Initiated Contact with Patient 10/12/16 1913     (approximate)  I have reviewed the triage vital signs and the nursing notes.   HISTORY  Chief Complaint Chest Pain  Spanish interpreter utilized  HPI Walter Marshall is a 82 y.o. male with no previous medical history, though the patient also reports he does not have a doctor or see her doctor.  Today the patient had a sudden onset of chest pain in his mid chest that lasts a couple minutes and radiates up towards his neck and both arms. Associated with a feeling of shortness of breath. It went away, but he had 2 more episodes since that time.  While in the waiting room he had a return of this episode, and reports a feeling as though he is having tightness in his neck, difficulty breathing, and wheezing the last for a couple  No recent illness. No previous history. No fevers or chills.   Past Medical History:  Diagnosis Date  . Patient denies medical problems     Patient Active Problem List   Diagnosis Date Noted  . Chest pain 10/13/2016    Past Surgical History:  Procedure Laterality Date  . NO PAST SURGERIES      Prior to Admission medications   Not on File    Allergies Review of patient's allergies indicates no known allergies.  Family History  Problem Relation Age of Onset  . Family history unknown: Yes    Social History Social History  Substance Use Topics  . Smoking status: Never Smoker  . Smokeless tobacco: Never Used  . Alcohol use Yes    Review of Systems Constitutional: No fever/chills Eyes: No visual changes. ENT: No sore throat. Cardiovascular: see history of present illness Respiratory:see history present illness Gastrointestinal: No abdominal pain.  No nausea, no vomiting.  No diarrhea.  No constipation. Genitourinary: Negative for  dysuria. Musculoskeletal: Negative for back pain. Skin: Negative for rash. Neurological: Negative for headaches, focal weakness or numbness.  10-point ROS otherwise negative.  ____________________________________________   PHYSICAL EXAM:  VITAL SIGNS: ED Triage Vitals  Enc Vitals Group     BP 10/12/16 1815 (!) 160/94     Pulse Rate 10/12/16 1815 79     Resp 10/12/16 1815 (!) 22     Temp 10/12/16 1815 98.5 F (36.9 C)     Temp Source 10/12/16 1815 Oral     SpO2 10/12/16 1815 98 %     Weight 10/12/16 1815 140 lb (63.5 kg)     Height 10/12/16 1815 5' (1.524 m)     Head Circumference --      Peak Flow --      Pain Score 10/13/16 0001 5     Pain Loc --      Pain Edu? --      Excl. in GC? --    Constitutional: alert Eyes: Conjunctivae are normal. PERRL. EOMI. Head: Atraumatic. Nose: No congestion/rhinnorhea. Mouth/Throat: Mucous membranes are moist.  Oropharynx non-erythematous. Neck: stridors breathingalmost as though and lary Cardiovascular: tachycardicrate, regular rhythm. Grossly normal heart sounds.  Good peripheral circulation. Respiratory: Mildly tachypnea. Mild inspiratory and expiratory stridorNo retractions. Lungs CTAB. Gastrointestinal: Soft and nontender. No distention. No abdominal bruits. Musculoskeletal: No lower extremity tenderness nor edema. Neurologic:  Normal speech and language. No gross focal neurologic deficits are appreciated.  Skin:  Skin is warm, dry and intact. No rash noted. Psychiatric: Mood  and affect are normal. Speech and behavior are normal.  Within about 1 minute of evaluation, patient reports all symptoms resolved and he is resting comfortably with clear lungs. Normal respirationsno ongoing stridor and in no distress. ____________________________________________   LABS (all labs ordered are listed, but only abnormal results are displayed)  Labs Reviewed  BASIC METABOLIC PANEL - Abnormal; Notable for the following:       Result Value    Glucose, Bld 134 (*)    Creatinine, Ser 1.31 (*)    GFR calc non Af Amer 50 (*)    GFR calc Af Amer 58 (*)    All other components within normal limits  CBC - Abnormal; Notable for the following:    RBC 4.32 (*)    HCT 38.9 (*)    Platelets 138 (*)    All other components within normal limits  TROPONIN I  TROPONIN I   ____________________________________________  EKG  ED ECG REPORT I, Ajna Moors, the attending physician, personally viewed and interpreted this ECG.  Date: 10/13/2016 EKG Time: 1810 Rate: 85 Rhythm: normal sinus rhythm QRS Axis: normal Intervals: normal ST/T Wave abnormalities: normal Conduction Disturbances: none Narrative Interpretation: unremarkable  Repeat EKG performed as the patient had a second episode of chest pain, on repeat 12-lead performed 2255, normal sinus rhythm, no significant changes except T wave abnormality seen in V1 and V3 associated with the patient's episode of chest pain. His chest pain resolved quickly thereafter within 1-2 minutes.Demonstrates evidence of T-wave abnormality associated with this chest pain.  ____________________________________________  RADIOLOGY  Dg Chest 2 View  Result Date: 10/12/2016 CLINICAL DATA:  Chest pain, shortness of breath and cough today EXAM: CHEST  2 VIEW COMPARISON:  None FINDINGS: Mild enlargement of cardiac silhouette. Mediastinal contours and pulmonary vascularity normal. Lungs clear. No pleural effusion or pneumothorax. Scattered endplate spur formation thoracic spine. IMPRESSION: No acute abnormalities. Mild enlargement of cardiac silhouette. Electronically Signed   By: Ulyses Southward M.D.   On: 10/12/2016 18:52   Ct Angio Chest Pe W Or Wo Contrast  Result Date: 10/12/2016 CLINICAL DATA:  Left-sided chest pain for several hours EXAM: CT ANGIOGRAPHY CHEST WITH CONTRAST TECHNIQUE: Multidetector CT imaging of the chest was performed using the standard protocol during bolus administration of intravenous  contrast. Multiplanar CT image reconstructions and MIPs were obtained to evaluate the vascular anatomy. CONTRAST:  75 mL Isovue 370. COMPARISON:  None. FINDINGS: Cardiovascular: The thoracic aorta shows no significant aneurysmal dilatation or dissection. Cardiac structures are within normal limits. Pulmonary artery demonstrates a normal branching pattern. No filling defects are identified to suggest pulmonary emboli. Mediastinum/Nodes: The thoracic inlet shows evidence of a large 3.2 cm hypodense nodule within the left lobe of the thyroid extending into the superior mediastinum. No significant lymphadenopathy is identified. Some scattered calcified lymph nodes are noted within the hila consistent with prior granulomatous disease. Lungs/Pleura: Lungs are well aerated bilaterally. Scattered calcifications are seen. This is consistent with prior granulomatous disease. No focal infiltrate or sizable effusion is seen. Upper Abdomen: No acute abnormality. Musculoskeletal: Degenerative change of the thoracic spine is noted. Review of the MIP images confirms the above findings. IMPRESSION: No evidence of pulmonary emboli. Changes of prior granulomatous disease. 3.2 cm left thyroid nodule. The need for further evaluation by a nonemergent ultrasound can be determined on a clinical basis. Electronically Signed   By: Alcide Clever M.D.   On: 10/12/2016 20:07    ____________________________________________   PROCEDURES  Procedure(s) performed: None  Procedures  Critical Care performed: No  ____________________________________________   INITIAL IMPRESSION / ASSESSMENT AND PLAN / ED COURSE  Pertinent labs & imaging results that were available during my care of the patient were reviewed by me and considered in my medical decision making (see chart for details).  Episodic chest pain. No previous history, but also no physician care. Somewhat atypical in that his symptoms are associated with stridorous type  breathing, and increased work of breathing and he reports improvement after receiving treatment in the ER and aspirin. During his chest pain observation, the patient had a second episode of pain which was associated with T wave abnormalities which were new compared with previous. His 2 troponins are both normal, and the time of admission he reports no pain or symptomatology.Because of his age, new T-wave abnormality in association with an episode of chest pain we will admit him for further workup. The etiology is still somewhat unclear ad I question a flurry of spasm may somehow be associated, but given this new T-wave abnormality during the setting of an episode of chest pain documented here did not feel the patient is a good candidate for outpatient follow-up and elevated concern for possible cardiac etiology.  Clinical Course     ____________________________________________   FINAL CLINICAL IMPRESSION(S) / ED DIAGNOSES  Final diagnoses:  Chest pain  Moderate risk chest pain      NEW MEDICATIONS STARTED DURING THIS VISIT:  New Prescriptions   No medications on file     Note:  This document was prepared using Dragon voice recognition software and may include unintentional dictation errors.     Sharyn CreamerMark Tykee Heideman, MD 10/13/16 0040

## 2016-10-13 NOTE — Progress Notes (Signed)
The family called the RN to the room for help. Upon entering the room the patient was noted having strider and holding his neck.  02 at 2L applied and patient is sating 98% on 2L. Noted a small amount of bright red blood saliva in the sink from the patient . Patient verbalized relief after the 02 application. Dr. Sheryle Hailiamond notified and he indicated he was going to order some breathing treatment and another chest X-ray. Will continue to monitor.

## 2016-10-13 NOTE — Progress Notes (Signed)
Patient is a admitted to room 247 with he diagnosis chest pain. Patient is Spanish speaking. Denied any acute chest pain at this moment. Skin assessment done with Yasmin S. RN, no skin issues of concern noted (skin warm and dry to touch). Box number called to CCMD with Marchelle Folksmanda as a second Training and development officerverifier. The RN called and one of the phone interpreters to help do admission assessment. [facility] [id number] but assessment was not completed because the patient was not following command and answer question. The grandson indicated the [facility] is [facility] and that getting interpreter physical at bedside will help.  Admission assessment will be completed during day shift when an interpreter is present. Will pass the information to the incoming RN and continue to monitor.

## 2016-10-13 NOTE — ED Notes (Signed)
Pt transported to room 247 

## 2016-10-14 ENCOUNTER — Observation Stay: Payer: Medicare Other

## 2016-10-14 DIAGNOSIS — R061 Stridor: Secondary | ICD-10-CM | POA: Diagnosis not present

## 2016-10-14 DIAGNOSIS — R079 Chest pain, unspecified: Secondary | ICD-10-CM | POA: Diagnosis not present

## 2016-10-14 DIAGNOSIS — J3802 Paralysis of vocal cords and larynx, bilateral: Secondary | ICD-10-CM | POA: Diagnosis not present

## 2016-10-14 DIAGNOSIS — E041 Nontoxic single thyroid nodule: Secondary | ICD-10-CM | POA: Diagnosis not present

## 2016-10-14 DIAGNOSIS — J385 Laryngeal spasm: Secondary | ICD-10-CM | POA: Diagnosis not present

## 2016-10-14 LAB — TSH: TSH: 2.803 u[IU]/mL (ref 0.350–4.500)

## 2016-10-14 MED ORDER — PANTOPRAZOLE SODIUM 40 MG PO TBEC: 40.0000 mg | DELAYED_RELEASE_TABLET | Freq: Two times a day (BID) | ORAL | 0 refills | Status: AC

## 2016-10-14 MED ORDER — CEFUROXIME AXETIL 500 MG PO TABS: 500.0000 mg | ORAL_TABLET | Freq: Two times a day (BID) | ORAL | 0 refills | Status: AC

## 2016-10-14 MED ORDER — GUAIFENESIN-DM 100-10 MG/5ML PO SYRP
5.0000 mL | ORAL_SOLUTION | ORAL | Status: DC | PRN
  Administered 2016-10-14 (×2): 5 mL via ORAL
  Filled 2016-10-14 (×2): qty 5

## 2016-10-14 MED ORDER — PREDNISONE 10 MG PO TABS
10.0000 mg | ORAL_TABLET | Freq: Every day | ORAL | 0 refills | Status: AC
Start: 2016-10-14 — End: ?

## 2016-10-14 MED ORDER — GUAIFENESIN-DM 100-10 MG/5ML PO SYRP: 5.0000 mL | ORAL_SOLUTION | ORAL | 0 refills | Status: AC | PRN

## 2016-10-14 NOTE — Progress Notes (Signed)
TB skin test  Or 5 units administered on the LFA at 0100. Patient tolerated the injection well. The skin test will be read in 48 hours. Will continue to monitor.

## 2016-10-14 NOTE — Progress Notes (Signed)
[facility] [place] [facility]   PATIENT NAME: Walter Marshall    MR#:  [id number]  DATE OF BIRTH:  [birth year]  SUBJECTIVE:   Patient Well this point. No episodes of stridor.  REVIEW OF SYSTEMS:    Review of Systems  Constitutional: Negative.  Negative for chills, fever and malaise/fatigue.  HENT: Negative.  Negative for ear discharge, ear pain, hearing loss, nosebleeds and sore throat.   Eyes: Negative.  Negative for blurred vision and pain.  Respiratory: Negative.  Negative for cough, hemoptysis, shortness of breath and wheezing.   Cardiovascular: Negative for chest pain, palpitations and leg swelling.  Gastrointestinal: Negative.  Negative for abdominal pain, blood in stool, diarrhea, nausea and vomiting.  Genitourinary: Negative.  Negative for dysuria.  Musculoskeletal: Negative.  Negative for back pain.  Skin: Negative.   Neurological: Negative for dizziness, tremors, speech change, focal weakness, seizures and headaches.  Endo/Heme/Allergies: Negative.  Does not bruise/bleed easily.  Psychiatric/Behavioral: Negative.  Negative for depression, hallucinations and suicidal ideas.    Tolerating Diet: NPO      DRUG ALLERGIES:  No Known Allergies  VITALS:  Blood pressure 128/60, pulse 90, temperature 97.7 F (36.5 C), temperature source Oral, resp. rate 16, height 5' (1.524 m), weight 67.8 kg (149 lb 8 oz), SpO2 95 %.  PHYSICAL EXAMINATION:   Physical Exam  Constitutional: He is oriented to person, place, and time and well-developed, well-nourished, and in no distress. No distress.  HENT:  Head: Normocephalic.  Eyes: No scleral icterus.  Neck: Normal range of motion. Neck supple. No JVD present. No tracheal deviation present.  Cardiovascular: Normal rate, regular rhythm and normal heart sounds.  Exam reveals no gallop and no friction rub.   No murmur heard. Pulmonary/Chest: Effort normal and breath sounds normal. No respiratory distress.  He has no wheezes. He has no rales. He exhibits no tenderness.  Abdominal: Soft. Bowel sounds are normal. He exhibits no distension and no mass. There is no tenderness. There is no rebound and no guarding.  Musculoskeletal: Normal range of motion. He exhibits no edema.  Neurological: He is alert and oriented to person, place, and time.  Skin: Skin is warm. No rash noted. No erythema.  Psychiatric: Affect and judgment normal.      LABORATORY PANEL:   CBC  Recent Labs Lab 10/13/16 0445  WBC 5.9  HGB 13.2  HCT 38.0*  PLT 141*   ------------------------------------------------------------------------------------------------------------------  Chemistries   Recent Labs Lab 10/13/16 0445  NA 139  K 4.2  CL 107  CO2 25  GLUCOSE 106*  BUN 15  CREATININE 1.24  CALCIUM 8.9   ------------------------------------------------------------------------------------------------------------------  Cardiac Enzymes  Recent Labs Lab 10/13/16 0445 10/13/16 1013 10/13/16 1621  TROPONINI <0.03 <0.03 <0.03   ------------------------------------------------------------------------------------------------------------------  RADIOLOGY:  Dg Chest 2 View  Result Date: 10/12/2016 CLINICAL DATA:  Chest pain, shortness of breath and cough today EXAM: CHEST  2 VIEW COMPARISON:  None FINDINGS: Mild enlargement of cardiac silhouette. Mediastinal contours and pulmonary vascularity normal. Lungs clear. No pleural effusion or pneumothorax. Scattered endplate spur formation thoracic spine. IMPRESSION: No acute abnormalities. Mild enlargement of cardiac silhouette. Electronically Signed   By: Ulyses SouthwardMark  Boles M.D.   On: 10/12/2016 18:52   Ct Angio Chest Pe W Or Wo Contrast  Result Date: 10/12/2016 CLINICAL DATA:  Left-sided chest pain for several hours EXAM: CT ANGIOGRAPHY CHEST WITH CONTRAST TECHNIQUE: Multidetector CT imaging of the chest was performed using the standard protocol during bolus  administration of  intravenous contrast. Multiplanar CT image reconstructions and MIPs were obtained to evaluate the vascular anatomy. CONTRAST:  75 mL Isovue 370. COMPARISON:  None. FINDINGS: Cardiovascular: The thoracic aorta shows no significant aneurysmal dilatation or dissection. Cardiac structures are within normal limits. Pulmonary artery demonstrates a normal branching pattern. No filling defects are identified to suggest pulmonary emboli. Mediastinum/Nodes: The thoracic inlet shows evidence of a large 3.2 cm hypodense nodule within the left lobe of the thyroid extending into the superior mediastinum. No significant lymphadenopathy is identified. Some scattered calcified lymph nodes are noted within the hila consistent with prior granulomatous disease. Lungs/Pleura: Lungs are well aerated bilaterally. Scattered calcifications are seen. This is consistent with prior granulomatous disease. No focal infiltrate or sizable effusion is seen. Upper Abdomen: No acute abnormality. Musculoskeletal: Degenerative change of the thoracic spine is noted. Review of the MIP images confirms the above findings. IMPRESSION: No evidence of pulmonary emboli. Changes of prior granulomatous disease. 3.2 cm left thyroid nodule. The need for further evaluation by a nonemergent ultrasound can be determined on a clinical basis. Electronically Signed   By: Alcide Clever M.D.   On: 10/12/2016 20:07   Mr Laqueta Jean ZO Contrast  Result Date: 10/13/2016 CLINICAL DATA:  Initial evaluation for bilateral vocal cord paralysis. EXAM: MRI HEAD WITHOUT AND WITH CONTRAST TECHNIQUE: Multiplanar, multiecho pulse sequences of the brain and surrounding structures were obtained without and with intravenous contrast. CONTRAST:  13mL MULTIHANCE GADOBENATE DIMEGLUMINE 529 MG/ML IV SOLN COMPARISON:  None. FINDINGS: Brain: Mild diffuse prominence of the CSF containing spaces is compatible with generalized age-related cerebral atrophy. Patchy T2/FLAIR  hyperintensity within the periventricular and deep white matter of both cerebral hemispheres is most compatible chronic microvascular ischemic changes. Overall, these changes are mild for age. No abnormal foci of restricted diffusion to suggest acute or subacute ischemia. Gray-white matter differentiation is maintained. No evidence for acute or chronic intracranial hemorrhage. No areas of chronic infarction identified. No mass lesion, midline shift, or mass effect. No hydrocephalus. No extra-axial fluid collection. Major dural sinuses are grossly patent. No abnormal enhancement. Incidental note made of a partially empty sella. Vascular: Major intracranial vascular flow voids are preserved. Skull and upper cervical spine: Craniocervical junction normal. Visualized upper cervical spine unremarkable. Bone marrow signal intensity within normal limits. No scalp soft tissue abnormality. Sinuses/Orbits: Globes and orbital soft tissues within normal limits. Scattered mucosal thickening throughout the paranasal sinuses. No air-fluid level to suggest active sinus infection. Small right mastoid effusion noted. Inner ear structures grossly normal. IMPRESSION: 1. No acute intracranial process. No findings to explain patient's symptoms identified. 2. Mild age-related cerebral atrophy with chronic small vessel ischemic disease. Electronically Signed   By: Rise Mu M.D.   On: 10/13/2016 21:14     ASSESSMENT AND PLAN:   82 year old male with no past medical history who presents with chest pressure and stridor and found to have bilateral vocal cord paralysis.  1. Bilateral true vocal cord paralysis of uncertain etiology or duration: Patient was evaluated by ENT. MRI did not show evidence of tumor or acute ischemia to explain this. Further workup is pending including workup for rheumatoid arthritis and lupus. PPD was placed and can be read on Tuesday. ENT recommended antibiotics for possible sinusitis, steroids  for inflammation of the vocal cords and antireflux medication.  2. Chest pain: This is due to problem #1. Patient was ruled out for ACS.  3. Thyroid nodule seen on CT scan: Thyroid ultrasound ordered as per recommendations by radiology.  Management plans discussed with the patient and he is in agreement.  CODE STATUS: full  TOTAL TIME TAKING CARE OF THIS PATIENT: 33 minutes.   Rounded with nursing  POSSIBLE D/C tomorrow, DEPENDING ON CLINICAL CONDITION.   Shamar Engelmann M.D on 10/14/2016 at 10:43 AM  Between 7am to 6pm - Pager - [phone] After 6pm go to [email or web address] - Social research officer, governmentpassword EPAS [email or web address]  [facility]  Office  [phone]  CC: Primary care physician; No PCP Per Patient  Note: This dictation was prepared with Dragon dictation along with smaller phrase technology. Any transcriptional errors that result from this process are unintentional.

## 2016-10-14 NOTE — Progress Notes (Signed)
Patient requested for cough syrup and standing order for Robitussin initiated and will be administered soon. Will continue to monitor.

## 2016-10-14 NOTE — Discharge Summary (Addendum)
[facility] [place] [facility]   PATIENT NAME: Walter Marshall    MR#:  [id number]  DATE OF BIRTH:  August 02, 1934  DATE OF ADMISSION:  10/12/2016 ADMITTING PHYSICIAN: Oralia Manis, MD  DATE OF DISCHARGE: 10/14/2016  PRIMARY CARE PHYSICIAN: No PCP Per Patient    ADMISSION DIAGNOSIS:  Chest pain [R07.9] Moderate risk chest pain [R07.9]  DISCHARGE DIAGNOSIS:  Principal Problem:   Chest pain Active Problems:   SOB (shortness of breath)   SECONDARY DIAGNOSIS:   Past Medical History:  Diagnosis Date  . Patient denies medical problems     HOSPITAL COURSE:    82 year old male with no past medical history who presents with chest pressure and stridor and found to have bilateral vocal cord paralysis.  1. Bilateral true vocal cord paralysis of uncertain etiology or duration: Patient was evaluated by ENT. MRI did not show evidence of tumor or acute ischemia to explain this. Further workup is pending including workup for rheumatoid arthritis and lupus. PPD was placed and can be read on Tuesday. ENT recommended antibiotics for possible sinusitis, steroids for inflammation of the vocal cords and antireflux medication.  2. Chest pain: This is due to problem #1. Patient was ruled out for ACS.  3. Thyroid nodule seen on CT scan: Patient underwent thyroid ultrasound by the advice of IR and there are 3 nodules one of which can be biopsied. He will have this done as an outpatient and will be ordered by Dr Willeen Cass.  DISCHARGE CONDITIONS AND DIET:   Stable regular diet  CONSULTS OBTAINED:  Treatment Team:  Marinus Maw, MD Pricilla Riffle, MD Berdine Dance, MD  DRUG ALLERGIES:  No Known Allergies  DISCHARGE MEDICATIONS:   Current Discharge Medication List    START taking these medications   Details  cefUROXime (CEFTIN) 500 MG tablet Take 1 tablet (500 mg total) by mouth 2 (two) times daily with a meal. Qty: 12 tablet, Refills: 0     guaiFENesin-dextromethorphan (ROBITUSSIN DM) 100-10 MG/5ML syrup Take 5 mLs by mouth every 4 (four) hours as needed for cough. Qty: 118 mL, Refills: 0    pantoprazole (PROTONIX) 40 MG tablet Take 1 tablet (40 mg total) by mouth 2 (two) times daily. Qty: 60 tablet, Refills: 0    predniSONE (DELTASONE) 10 MG tablet Take 1 tablet (10 mg total) by mouth daily with breakfast. 60 mg PO (ORAL) x 2 days 50 mg PO (ORAL)  x 2 days 40 mg PO (ORAL)  x 2 days 30 mg PO  (ORAL)  x 2 days 20 mg PO  (ORAL) x 2 days 10 mg PO  (ORAL) x 2 days then stop Qty: 42 tablet, Refills: 0              Today   CHIEF COMPLAINT:   Patient is breathing well no episodes of stridor.  VITAL SIGNS:  Blood pressure (!) 123/59, pulse 88, temperature 97.7 F (36.5 C), temperature source Oral, resp. rate 19, height 5' (1.524 m), weight 67.8 kg (149 lb 8 oz), SpO2 93 %.   REVIEW OF SYSTEMS:  Review of Systems  Constitutional: Negative.  Negative for chills, fever and malaise/fatigue.  HENT: Negative.  Negative for ear discharge, ear pain, hearing loss, nosebleeds and sore throat.   Eyes: Negative.  Negative for blurred vision and pain.  Respiratory: Negative.  Negative for cough, hemoptysis, shortness of breath and wheezing.   Cardiovascular: Negative.  Negative for chest pain, palpitations and leg swelling.  Gastrointestinal: Negative.  Negative for abdominal pain, blood in stool, diarrhea, nausea and vomiting.  Genitourinary: Negative.  Negative for dysuria.  Musculoskeletal: Negative.  Negative for back pain.  Skin: Negative.   Neurological: Negative for dizziness, tremors, speech change, focal weakness, seizures and headaches.  Endo/Heme/Allergies: Negative.  Does not bruise/bleed easily.  Psychiatric/Behavioral: Negative.  Negative for depression, hallucinations and suicidal ideas.     PHYSICAL EXAMINATION:  GENERAL:  82 y.o.-year-old patient lying in the bed with no acute distress.  NECK:   Supple, no jugular venous distention. No thyroid enlargement, no tenderness.  LUNGS: Normal breath sounds bilaterally, no wheezing, rales,rhonchi  No use of accessory muscles of respiration.  CARDIOVASCULAR: S1, S2 normal. No murmurs, rubs, or gallops.  ABDOMEN: Soft, non-tender, non-distended. Bowel sounds present. No organomegaly or mass.  EXTREMITIES: No pedal edema, cyanosis, or clubbing.  PSYCHIATRIC: The patient is alert and oriented x 3.  SKIN: No obvious rash, lesion, or ulcer.   DATA REVIEW:   CBC  Recent Labs Lab 10/13/16 0445  WBC 5.9  HGB 13.2  HCT 38.0*  PLT 141*    Chemistries   Recent Labs Lab 10/13/16 0445  NA 139  K 4.2  CL 107  CO2 25  GLUCOSE 106*  BUN 15  CREATININE 1.24  CALCIUM 8.9    Cardiac Enzymes  Recent Labs Lab 10/13/16 0445 10/13/16 1013 10/13/16 1621  TROPONINI <0.03 <0.03 <0.03    Microbiology Results  @[email or web address] @  RADIOLOGY:  Dg Chest 2 View  Result Date: 10/12/2016 CLINICAL DATA:  Chest pain, shortness of breath and cough today EXAM: CHEST  2 VIEW COMPARISON:  None FINDINGS: Mild enlargement of cardiac silhouette. Mediastinal contours and pulmonary vascularity normal. Lungs clear. No pleural effusion or pneumothorax. Scattered endplate spur formation thoracic spine. IMPRESSION: No acute abnormalities. Mild enlargement of cardiac silhouette. Electronically Signed   By: Ulyses SouthwardMark  Boles M.D.   On: 10/12/2016 18:52   Ct Angio Chest Pe W Or Wo Contrast  Result Date: 10/12/2016 CLINICAL DATA:  Left-sided chest pain for several hours EXAM: CT ANGIOGRAPHY CHEST WITH CONTRAST TECHNIQUE: Multidetector CT imaging of the chest was performed using the standard protocol during bolus administration of intravenous contrast. Multiplanar CT image reconstructions and MIPs were obtained to evaluate the vascular anatomy. CONTRAST:  75 mL Isovue 370. COMPARISON:  None. FINDINGS: Cardiovascular: The thoracic aorta shows no significant aneurysmal  dilatation or dissection. Cardiac structures are within normal limits. Pulmonary artery demonstrates a normal branching pattern. No filling defects are identified to suggest pulmonary emboli. Mediastinum/Nodes: The thoracic inlet shows evidence of a large 3.2 cm hypodense nodule within the left lobe of the thyroid extending into the superior mediastinum. No significant lymphadenopathy is identified. Some scattered calcified lymph nodes are noted within the hila consistent with prior granulomatous disease. Lungs/Pleura: Lungs are well aerated bilaterally. Scattered calcifications are seen. This is consistent with prior granulomatous disease. No focal infiltrate or sizable effusion is seen. Upper Abdomen: No acute abnormality. Musculoskeletal: Degenerative change of the thoracic spine is noted. Review of the MIP images confirms the above findings. IMPRESSION: No evidence of pulmonary emboli. Changes of prior granulomatous disease. 3.2 cm left thyroid nodule. The need for further evaluation by a nonemergent ultrasound can be determined on a clinical basis. Electronically Signed   By: Alcide CleverMark  Lukens M.D.   On: 10/12/2016 20:07   Mr Laqueta JeanBrain W RUWo Contrast  Result Date: 10/13/2016 CLINICAL DATA:  Initial evaluation for bilateral vocal cord paralysis. EXAM: MRI HEAD WITHOUT AND WITH  CONTRAST TECHNIQUE: Multiplanar, multiecho pulse sequences of the brain and surrounding structures were obtained without and with intravenous contrast. CONTRAST:  13mL MULTIHANCE GADOBENATE DIMEGLUMINE 529 MG/ML IV SOLN COMPARISON:  None. FINDINGS: Brain: Mild diffuse prominence of the CSF containing spaces is compatible with generalized age-related cerebral atrophy. Patchy T2/FLAIR hyperintensity within the periventricular and deep white matter of both cerebral hemispheres is most compatible chronic microvascular ischemic changes. Overall, these changes are mild for age. No abnormal foci of restricted diffusion to suggest acute or subacute  ischemia. Gray-white matter differentiation is maintained. No evidence for acute or chronic intracranial hemorrhage. No areas of chronic infarction identified. No mass lesion, midline shift, or mass effect. No hydrocephalus. No extra-axial fluid collection. Major dural sinuses are grossly patent. No abnormal enhancement. Incidental note made of a partially empty sella. Vascular: Major intracranial vascular flow voids are preserved. Skull and upper cervical spine: Craniocervical junction normal. Visualized upper cervical spine unremarkable. Bone marrow signal intensity within normal limits. No scalp soft tissue abnormality. Sinuses/Orbits: Globes and orbital soft tissues within normal limits. Scattered mucosal thickening throughout the paranasal sinuses. No air-fluid level to suggest active sinus infection. Small right mastoid effusion noted. Inner ear structures grossly normal. IMPRESSION: 1. No acute intracranial process. No findings to explain patient's symptoms identified. 2. Mild age-related cerebral atrophy with chronic small vessel ischemic disease. Electronically Signed   By: Rise MuBenjamin  McClintock M.D.   On: 10/13/2016 21:14   [place] Thyroid  Result Date: 10/14/2016 CLINICAL DATA:  Left inferior thyroid nodule by chest CT EXAM: THYROID ULTRASOUND TECHNIQUE: Ultrasound examination of the thyroid gland and adjacent soft tissues was performed. COMPARISON:  10/12/2016 chest CT FINDINGS: Parenchymal Echotexture: Moderately heterogenous Estimated total number of nodules >/= 1 cm: 3 Number of spongiform nodules >/=  2 cm not described below (TR1): 0 Number of mixed cystic and solid nodules >/= 1.5 cm not described below (TR2): 0 _________________________________________________________ Isthmus: 5 mm No discrete nodules are identified within the thyroid isthmus. _________________________________________________________ Right lobe: 3.1 x 1.0 x 1.4 cm Nodule # 1: Location: Right; Superior Size: 10 x 8 x 7 mm  Composition: solid/almost completely solid (2) Echogenicity: hypoechoic (2) Shape: not taller-than-wide (0) Margins: ill-defined (0) Echogenic foci: none (0) ACR TI-RADS total points: 4. ACR TI-RADS risk category: TR4 (4-6 points). ACR TI-RADS recommendations: *Given size (>/= 1 - 1.4 cm) and appearance, a follow-up ultrasound in 1 year should be considered based on TI-RADS criteria. Nodule # 2: Location: Right; Mid Size: 13 x 10 x 9 mm Composition: solid/almost completely solid (2) Echogenicity: isoechoic (1) Shape: not taller-than-wide (0) Margins: ill-defined (0) Echogenic foci: none (0) ACR TI-RADS total points: 3. ACR TI-RADS risk category: TR3 (3 points). ACR TI-RADS recommendations: Given size (<1.4 cm) and appearance, this nodule does NOT meet TI-RADS criteria for biopsy or dedicated follow-up. _________________________________________________________ Left lobe: 3.2 x 2.6 x 3.6 cm Nodule # 3: Location: Left; Inferior Size: 2.7 x 2.3 x 3.2 cm Composition: solid/almost completely solid (2) Echogenicity: isoechoic (1) Shape: not taller-than-wide (0) Margins: ill-defined (0) Echogenic foci: none (0) ACR TI-RADS total points: 3. ACR TI-RADS risk category: TR3 (3 points). ACR TI-RADS recommendations: **Given size (>/= 2.5 cm) and appearance, fine needle aspiration of this mildly suspicious nodule should be considered based on TI-RADS criteria. IMPRESSION: 2.7 cm left inferior solid thyroid nodule (TR 3), meets criteria for biopsy as above. Additional right thyroid nodules as above. The above is in keeping with the ACR TI-RADS recommendations - J Am Coll Radiol 2017;14:587-595. Electronically  Signed   By: Judie Petit.  Shick M.D.   On: 10/14/2016 11:58      Management plans discussed with the patient and he is in agreement. Stable for discharge home  Patient should follow up with dr Willeen Cass  CODE STATUS:     Code Status Orders        Start     Ordered   10/13/16 0157  Full code  Continuous     10/13/16  0157    Code Status History    Date Active Date Inactive Code Status Order ID Comments User Context   This patient has a current code status but no historical code status.      TOTAL TIME TAKING CARE OF THIS PATIENT: 33 minutes.    Note: This dictation was prepared with Dragon dictation along with smaller phrase technology. Any transcriptional errors that result from this process are unintentional.  Alton Tremblay M.D on 10/14/2016 at 1:59 PM  Between 7am to 6pm - Pager - [phone] After 6pm go to [email or web address] - Social research officer, government  [facility]  Office  [phone]  CC: Primary care physician; No PCP Per Patient

## 2016-10-14 NOTE — Progress Notes (Signed)
Patient is stable this shift,no pain,no stridors,remains on room air,continues on steriods for inflammation of vocal cords,Thyroid ultrasound done and reveals nodule that meets criteria for biopsy,awaiting MD's decision for discharge after speaking to interventional Radiologist.

## 2016-10-14 NOTE — Progress Notes (Signed)
Patient discharged home as ordered,instructions printed in spanish and english and explained.follow up appointment made,escorted by staff and family via wheel chair,vital signs within normal limits upon discharge.

## 2016-10-14 NOTE — Consult Note (Signed)
Walter Marshall, Walter Marshall [id number] 11/18/1934 Riley Nearing, MD  Consulting Physician: Riley Nearing, MD  HPI: This 82 y.o. year old male was admitted on 10/12/2016 for Chest pain [R07.9] Moderate risk chest pain [R07.9]. Patient has done well although he has had some cough and it sounds like he may have had a brief episode last night where the throat felt like it was closing, but this responded quickly to cough medication. This was most likely a brief period of laryngospasm. He is not having any ongoing stridor and feels he can breathe comfortably at rest. He has not been coughing up any colored sputum and has not had any blood-tinged sputum persisting. He denies any history of tuberculosis exposure that he is aware of. He has never had a prior intubation. He indicates that he has been very active up until this admission with no shortness of breath. He is a little hoarse today from the coughing.  Medications:  Current Facility-Administered Medications  Medication Dose Route Frequency Provider Last Rate Last Dose  . acetaminophen (TYLENOL) tablet 650 mg  650 mg Oral Q6H PRN Lance Coon, MD       Or  . acetaminophen (TYLENOL) suppository 650 mg  650 mg Rectal Q6H PRN Lance Coon, MD      . aspirin chewable tablet 81 mg  81 mg Oral Daily Bettey Costa, MD   81 mg at 10/14/16 0847  . cefUROXime (CEFTIN) tablet 500 mg  500 mg Oral BID WC Clyde Canterbury, MD   500 mg at 10/14/16 0847  . dexamethasone (DECADRON) injection 4 mg  4 mg Intravenous Q8H Clyde Canterbury, MD   4 mg at 10/14/16 0541  . enoxaparin (LOVENOX) injection 40 mg  40 mg Subcutaneous Q24H Lance Coon, MD   40 mg at 10/13/16 2107  . guaiFENesin-dextromethorphan (ROBITUSSIN DM) 100-10 MG/5ML syrup 5 mL  5 mL Oral Q4H PRN Bettey Costa, MD   5 mL at 10/14/16 0846  . ipratropium-albuterol (DUONEB) 0.5-2.5 (3) MG/3ML nebulizer solution 3 mL  3 mL Nebulization Q4H PRN Bettey Costa, MD      . ondansetron (ZOFRAN) tablet 4 mg  4 mg Oral Q6H PRN Lance Coon, MD       Or  . ondansetron [facility]) injection 4 mg  4 mg Intravenous Q6H PRN Lance Coon, MD      . pantoprazole (PROTONIX) EC tablet 40 mg  40 mg Oral BID AC Clyde Canterbury, MD   40 mg at 10/14/16 0846  . tuberculin injection 5 Units  5 Units Intradermal Once Bettey Costa, MD   5 Units at 10/14/16 0058  .  No prescriptions prior to admission.    Allergies: No Known Allergies  PMH:  Past Medical History:  Diagnosis Date  . Patient denies medical problems     Fam Hx:  Family History  Problem Relation Age of Onset  . Family history unknown: Yes    Soc Hx:  Social History   Social History  . Marital status: Widowed    Spouse name: N/A  . Number of children: N/A  . Years of education: N/A   Occupational History  . Not on file.   Social History Main Topics  . Smoking status: Never Smoker  . Smokeless tobacco: Never Used  . Alcohol use Yes  . Drug use: No  . Sexual activity: Not on file   Other Topics Concern  . Not on file   Social History Narrative  . No narrative on file  PSH:  Past Surgical History:  Procedure Laterality Date  . NO PAST SURGERIES    . Procedures since admission: No admission procedures for hospital encounter.  ROS: Review of systems normal other than 12 systems except per HPI.  PHYSICAL EXAM  Vitals: Blood pressure 128/60, pulse 90, temperature 97.7 F (36.5 C), temperature source Oral, resp. rate 16, height 5' (1.524 m), weight 67.8 kg (149 lb 8 oz), SpO2 95 %.. General: Well-developed, Well-nourished in no acute distress Mood: Mood and affect well adjusted, pleasant and cooperative. Orientation: Grossly alert and oriented. Vocal Quality: Mild hoarseness, no stridor. Communicates verbally. head and Face: NCAT. No facial asymmetry. No visible skin lesions. No significant facial scars. No tenderness with sinus percussion. Facial strength normal and symmetric. Respiratory: Normal respiratory effort without labored breathing. Lungs  are clear to auscultation bilaterally. Cardiovascular: Carotid pulse shows regular rate and rhythm  MEDICAL DECISION MAKING: Data Review:  Results for orders placed or performed during the hospital encounter of 10/12/16 (from the past 48 hour(s))  Basic metabolic panel     Status: Abnormal   Collection Time: 10/12/16  6:20 PM  Result Value Ref Range   Sodium 137 135 - 145 mmol/L   Potassium 4.2 3.5 - 5.1 mmol/L   Chloride 102 101 - 111 mmol/L   CO2 27 22 - 32 mmol/L   Glucose, Bld 134 (H) 65 - 99 mg/dL   BUN 18 6 - 20 mg/dL   Creatinine, Ser 1.31 (H) 0.61 - 1.24 mg/dL   Calcium 9.2 8.9 - 10.3 mg/dL   GFR calc non Af Amer 50 (L) >60 mL/min   GFR calc Af Amer 58 (L) >60 mL/min    Comment: (NOTE) The eGFR has been calculated using the CKD EPI equation. This calculation has not been validated in all clinical situations. eGFR's persistently <60 mL/min signify possible Chronic Kidney Disease.    Anion gap 8 5 - 15  CBC     Status: Abnormal   Collection Time: 10/12/16  6:20 PM  Result Value Ref Range   WBC 6.5 3.8 - 10.6 K/uL   RBC 4.32 (L) 4.40 - 5.90 MIL/uL   Hemoglobin 13.7 13.0 - 18.0 g/dL   HCT 38.9 (L) 40.0 - 52.0 %   MCV 90.1 80.0 - 100.0 fL   MCH 31.7 26.0 - 34.0 pg   MCHC 35.2 32.0 - 36.0 g/dL   RDW 12.9 11.5 - 14.5 %   Platelets 138 (L) 150 - 440 K/uL  Troponin I     Status: None   Collection Time: 10/12/16  6:20 PM  Result Value Ref Range   Troponin I <0.03 <0.03 ng/mL  Troponin I     Status: None   Collection Time: 10/12/16 10:28 PM  Result Value Ref Range   Troponin I <0.03 <0.03 ng/mL  Basic metabolic panel     Status: Abnormal   Collection Time: 10/13/16  4:45 AM  Result Value Ref Range   Sodium 139 135 - 145 mmol/L   Potassium 4.2 3.5 - 5.1 mmol/L   Chloride 107 101 - 111 mmol/L   CO2 25 22 - 32 mmol/L   Glucose, Bld 106 (H) 65 - 99 mg/dL   BUN 15 6 - 20 mg/dL   Creatinine, Ser 1.24 0.61 - 1.24 mg/dL   Calcium 8.9 8.9 - 10.3 mg/dL   GFR calc non Af  Amer 53 (L) >60 mL/min   GFR calc Af Amer >60 >60 mL/min    Comment: (NOTE) The  eGFR has been calculated using the CKD EPI equation. This calculation has not been validated in all clinical situations. eGFR's persistently <60 mL/min signify possible Chronic Kidney Disease.    Anion gap 7 5 - 15  CBC     Status: Abnormal   Collection Time: 10/13/16  4:45 AM  Result Value Ref Range   WBC 5.9 3.8 - 10.6 K/uL   RBC 4.22 (L) 4.40 - 5.90 MIL/uL   Hemoglobin 13.2 13.0 - 18.0 g/dL   HCT 38.0 (L) 40.0 - 52.0 %   MCV 90.2 80.0 - 100.0 fL   MCH 31.4 26.0 - 34.0 pg   MCHC 34.8 32.0 - 36.0 g/dL   RDW 13.0 11.5 - 14.5 %   Platelets 141 (L) 150 - 440 K/uL  Troponin I     Status: None   Collection Time: 10/13/16  4:45 AM  Result Value Ref Range   Troponin I <0.03 <0.03 ng/mL  Troponin I     Status: None   Collection Time: 10/13/16 10:13 AM  Result Value Ref Range   Troponin I <0.03 <0.03 ng/mL  Troponin I     Status: None   Collection Time: 10/13/16  4:21 PM  Result Value Ref Range   Troponin I <0.03 <0.03 ng/mL  Sedimentation rate     Status: Abnormal   Collection Time: 10/13/16  4:21 PM  Result Value Ref Range   Sed Rate 26 (H) 0 - 20 mm/hr  . Dg Chest 2 View  Result Date: 10/12/2016 CLINICAL DATA:  Chest pain, shortness of breath and cough today EXAM: CHEST  2 VIEW COMPARISON:  None FINDINGS: Mild enlargement of cardiac silhouette. Mediastinal contours and pulmonary vascularity normal. Lungs clear. No pleural effusion or pneumothorax. Scattered endplate spur formation thoracic spine. IMPRESSION: No acute abnormalities. Mild enlargement of cardiac silhouette. Electronically Signed   By: Lavonia Dana M.D.   On: 10/12/2016 18:52   Ct Angio Chest Pe W Or Wo Contrast  Result Date: 10/12/2016 CLINICAL DATA:  Left-sided chest pain for several hours EXAM: CT ANGIOGRAPHY CHEST WITH CONTRAST TECHNIQUE: Multidetector CT imaging of the chest was performed using the standard protocol during bolus  administration of intravenous contrast. Multiplanar CT image reconstructions and MIPs were obtained to evaluate the vascular anatomy. CONTRAST:  75 mL Isovue 370. COMPARISON:  None. FINDINGS: Cardiovascular: The thoracic aorta shows no significant aneurysmal dilatation or dissection. Cardiac structures are within normal limits. Pulmonary artery demonstrates a normal branching pattern. No filling defects are identified to suggest pulmonary emboli. Mediastinum/Nodes: The thoracic inlet shows evidence of a large 3.2 cm hypodense nodule within the left lobe of the thyroid extending into the superior mediastinum. No significant lymphadenopathy is identified. Some scattered calcified lymph nodes are noted within the hila consistent with prior granulomatous disease. Lungs/Pleura: Lungs are well aerated bilaterally. Scattered calcifications are seen. This is consistent with prior granulomatous disease. No focal infiltrate or sizable effusion is seen. Upper Abdomen: No acute abnormality. Musculoskeletal: Degenerative change of the thoracic spine is noted. Review of the MIP images confirms the above findings. IMPRESSION: No evidence of pulmonary emboli. Changes of prior granulomatous disease. 3.2 cm left thyroid nodule. The need for further evaluation by a nonemergent ultrasound can be determined on a clinical basis. Electronically Signed   By: Inez Catalina M.D.   On: 10/12/2016 20:07   Mr Jeri Cos ZO Contrast  Result Date: 10/13/2016 CLINICAL DATA:  Initial evaluation for bilateral vocal cord paralysis. EXAM: MRI HEAD WITHOUT AND WITH CONTRAST  TECHNIQUE: Multiplanar, multiecho pulse sequences of the brain and surrounding structures were obtained without and with intravenous contrast. CONTRAST:  35m MULTIHANCE GADOBENATE DIMEGLUMINE 529 MG/ML IV SOLN COMPARISON:  None. FINDINGS: Brain: Mild diffuse prominence of the CSF containing spaces is compatible with generalized age-related cerebral atrophy. Patchy T2/FLAIR  hyperintensity within the periventricular and deep white matter of both cerebral hemispheres is most compatible chronic microvascular ischemic changes. Overall, these changes are mild for age. No abnormal foci of restricted diffusion to suggest acute or subacute ischemia. Gray-white matter differentiation is maintained. No evidence for acute or chronic intracranial hemorrhage. No areas of chronic infarction identified. No mass lesion, midline shift, or mass effect. No hydrocephalus. No extra-axial fluid collection. Major dural sinuses are grossly patent. No abnormal enhancement. Incidental note made of a partially empty sella. Vascular: Major intracranial vascular flow voids are preserved. Skull and upper cervical spine: Craniocervical junction normal. Visualized upper cervical spine unremarkable. Bone marrow signal intensity within normal limits. No scalp soft tissue abnormality. Sinuses/Orbits: Globes and orbital soft tissues within normal limits. Scattered mucosal thickening throughout the paranasal sinuses. No air-fluid level to suggest active sinus infection. Small right mastoid effusion noted. Inner ear structures grossly normal. IMPRESSION: 1. No acute intracranial process. No findings to explain patient's symptoms identified. 2. Mild age-related cerebral atrophy with chronic small vessel ischemic disease. Electronically Signed   By: BJeannine BogaM.D.   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 Sedimentation rate was minimally elevated, and his labs and PPD results are pending to evaluate for possible granulomatous disease. MRI did not show evidence of any tumor or acute ischemic event to explain this. There was some mild inflammatory change in the sinuses, so it is possible that the postnasal drainage noted on exam yesterday had created some airway inflammation, resulting in swelling and some laryngospasm to cause his  intermittent airway compromise. He had one episode last night of it feeling tight to breathe briefly but responded to cough medication. Whether he may have had bilateral cord paralysis for a while is uncertain. Certainly viral vocal cord paralysis can occur, though bilateral paralysis would seem unusual.  Further workup is pending regarding the left thyroid nodule. He does seem to be improving however and during his hospitalization has not had any ongoing stridor.  PLAN: The remaining workup may take some time to get results back including the needle aspiration and some of the labs that have been ordered. He seems to be stable, so discharge would be reasonable, though I would cover him with a 10 day course of Ceftin for sinusitis, a Sterapred double strength 6 day taper to help reduce any airway edema, and treat any reflux related inflammation aggressively with Protonix 40 mg by mouth twice a day until we get these issues sorted out further. Also reasonable to have him use a cough suppressant to reduce any issues with laryngospasm.   I will leave his disposition up to the medicine service, and they're going to check with radiology to see if he might be able to get the ultrasound-guided FNA prior to discharge. The other issue is having someone read the PPD that was placed yesterday. I am happy to see him in follow-up to continue his workup. Assuming the cord paralysis persists, [facility] consider an arytenoidectomy would be an option in the future. Obviously some of this will depend on the workup of the thyroid nodule. I can see  him back in the office later this week for follow-up.   Riley Nearing, MD 10/14/2016 10:07 AM

## 2016-10-15 LAB — ANGIOTENSIN CONVERTING ENZYME: ANGIOTENSIN-CONVERTING ENZYME: 34 U/L (ref 14–82)

## 2016-10-15 LAB — RHEUMATOID FACTOR: Rhuematoid fact SerPl-aCnc: <10 IU/mL (ref 0.0–13.9)

## 2016-10-16 LAB — ANCA TITERS
Atypical P-ANCA titer: <1:20 {titer}
C-ANCA: <1:20 {titer}
P-ANCA: <1:20 {titer}

## 2016-10-17 DIAGNOSIS — E041 Nontoxic single thyroid nodule: Secondary | ICD-10-CM | POA: Diagnosis not present

## 2016-10-17 DIAGNOSIS — J3802 Paralysis of vocal cords and larynx, bilateral: Secondary | ICD-10-CM | POA: Diagnosis not present

## 2016-10-18 ENCOUNTER — Other Ambulatory Visit: Payer: Self-pay | Admitting: Otolaryngology

## 2016-10-18 DIAGNOSIS — E041 Nontoxic single thyroid nodule: Secondary | ICD-10-CM

## 2016-10-23 ENCOUNTER — Ambulatory Visit: Admission: RE | Admit: 2016-10-23 | Payer: Medicare Other | Source: Ambulatory Visit

## 2016-11-05 ENCOUNTER — Ambulatory Visit
Admission: RE | Admit: 2016-11-05 | Discharge: 2016-11-05 | Disposition: A | Discharge status: Home / Self Care | Payer: Medicare Other | Source: Ambulatory Visit | Attending: Otolaryngology | Admitting: Otolaryngology

## 2016-11-05 DIAGNOSIS — E041 Nontoxic single thyroid nodule: Secondary | ICD-10-CM | POA: Diagnosis not present

## 2016-11-05 NOTE — Procedures (Signed)
US guided FNA of left dominant thyroid nodule.  Total of 5 FNAs obtained.  No immediate complication.

## 2016-11-05 NOTE — Discharge Instructions (Signed)
Biopsia de tiroides, cuidados posteriores  (Thyroid Biopsy, Care After)  Siga estas instrucciones durante las próximas semanas. Estas indicaciones le proporcionan información general acerca de cómo deberá cuidarse después del procedimiento. El médico también podrá darle instrucciones más específicas. El tratamiento ha sido planificado según las prácticas médicas actuales, pero en algunos casos pueden ocurrir problemas. Comuníquese con el médico si tiene algún problema o tiene dudas después del procedimiento.  QUÉ ESPERAR DESPUÉS DEL PROCEDIMIENTO  Después del procedimiento, es normal tener lo siguiente:  · Puede sentir molestias y dolor con la palpación en el lugar de la biopsia durante algunos días.  · Si le hicieron una biopsia abierta, puede tener dolor de garganta o la voz ronca. Esto debe desaparecer después de un par de días.  INSTRUCCIONES PARA EL CUIDADO EN EL HOGAR  · Tome los medicamentos solamente como se lo haya indicado el médico.  · Para aliviar las molestias en el lugar de la biopsia:  ? Cuando esté acostado, mantenga la cabeza elevada sobre una almohada.  ? Cuando se incorpore, sosténgase la parte posterior de la cabeza y el cuello con ambas manos.  · Si tiene dolor de garganta, intente chupar pastillas para la garganta o hacer gárgaras con agua salada templada.  · Concurra a todas las visitas de control como se lo haya indicado el médico. Esto es importante.    SOLICITE ATENCIÓN MÉDICA SI:  · Tiene fiebre.    SOLICITE ATENCIÓN MÉDICA DE INMEDIATO SI:  · Tiene sangrado abundante en el lugar de la biopsia.  · Tiene dificultad para tragar.  · Tiene secreción, enrojecimiento, hinchazón o dolor en el lugar de la biopsia.  · Las glándulas del cuello (ganglios linfáticos) están hinchadas.    Esta información no tiene como fin reemplazar el consejo del médico. Asegúrese de hacerle al médico cualquier pregunta que tenga.  Document Released: 06/23/2014 Document Revised: 06/23/2014 Document Reviewed:  02/18/2014  Elsevier Interactive Patient Education © 2017 [employer]

## 2016-11-07 LAB — CYTOLOGY - NON PAP

## 2016-12-07 DIAGNOSIS — E041 Nontoxic single thyroid nodule: Secondary | ICD-10-CM | POA: Diagnosis not present

## 2016-12-07 DIAGNOSIS — J3802 Paralysis of vocal cords and larynx, bilateral: Secondary | ICD-10-CM | POA: Diagnosis not present

## 2017-08-15 IMAGING — US US THYROID
1 series · 13 of 25 positions shown · non-contrast
Comparison: 10/12/2016 chest CT

CLINICAL DATA: Left inferior thyroid nodule by chest CT

EXAM:
THYROID ULTRASOUND
TECHNIQUE: Ultrasound examination of the thyroid gland and adjacent soft
tissues was performed.

[Series 1: us thyroid · 0.09mm/px · 13 of 57 slices shown]
[im 1/57]
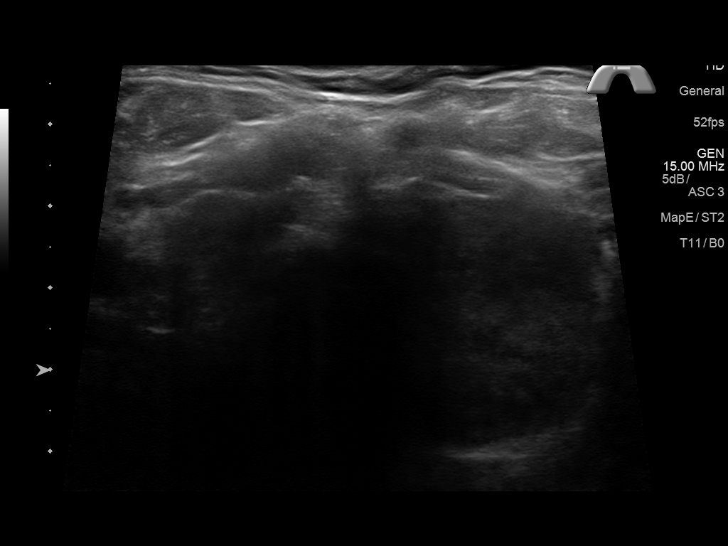
[im 5/57]
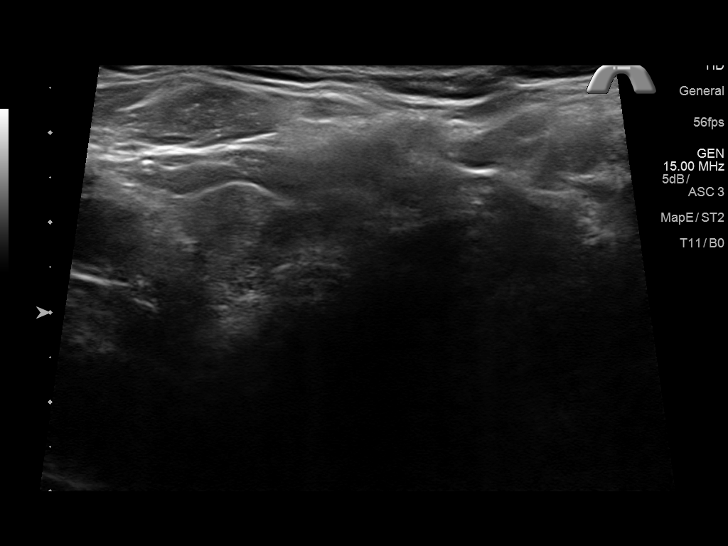
[im 10/57]
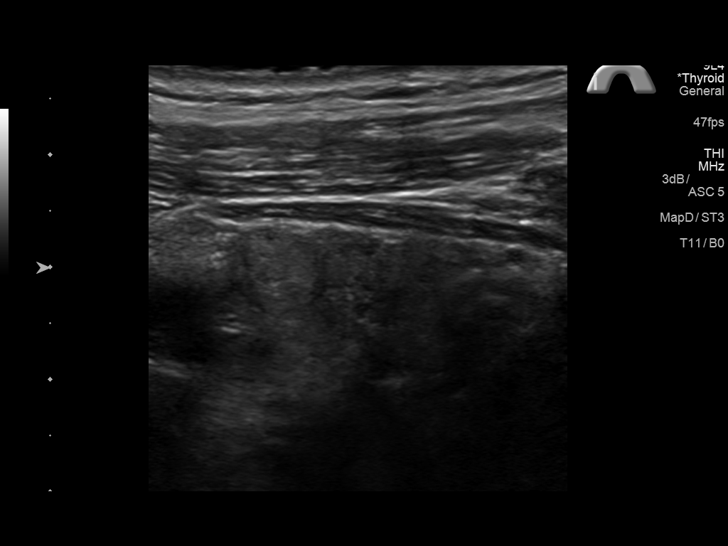
[im 15/57]
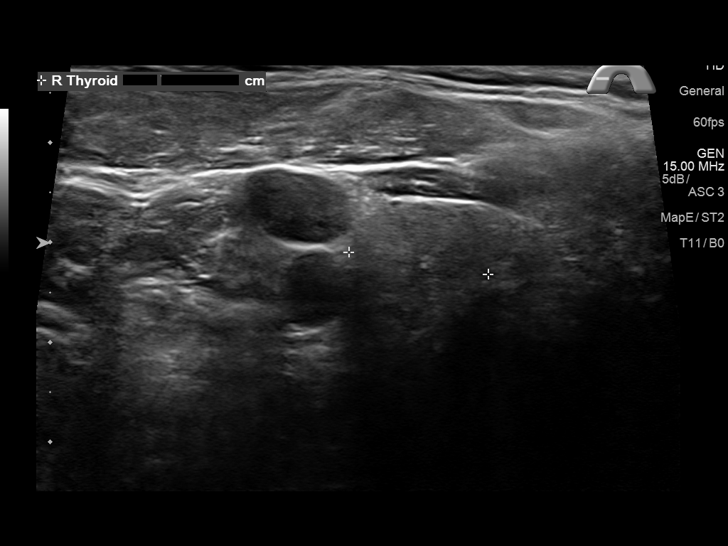
[im 19/57]
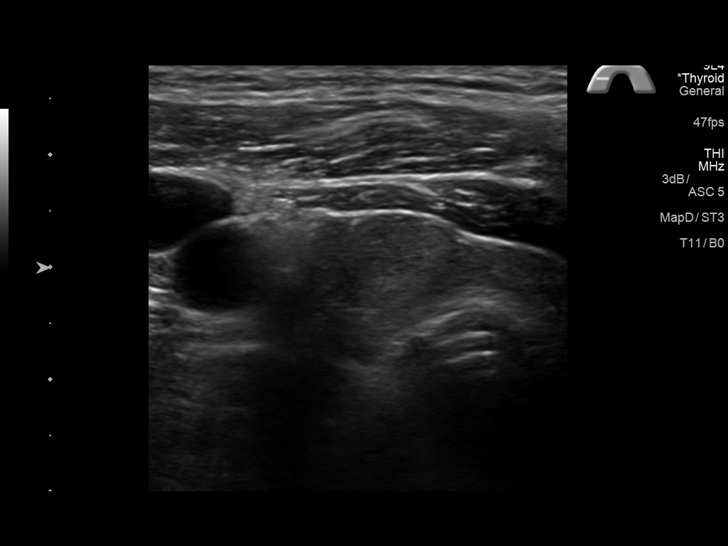
[im 24/57]
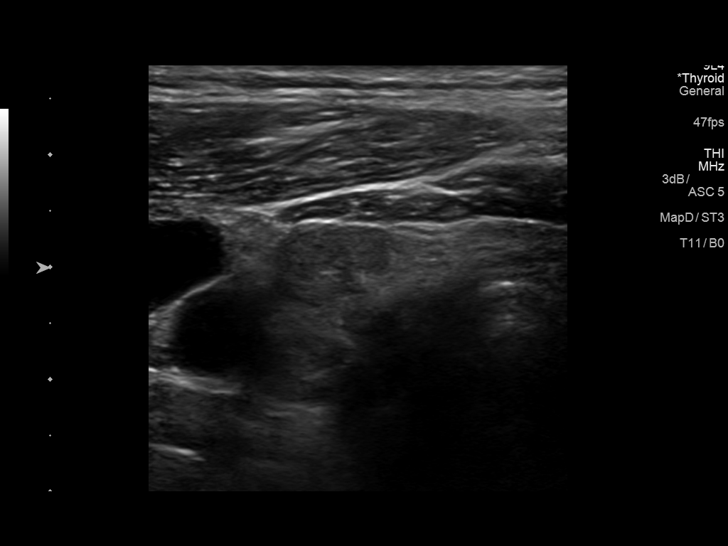
[im 29/57]
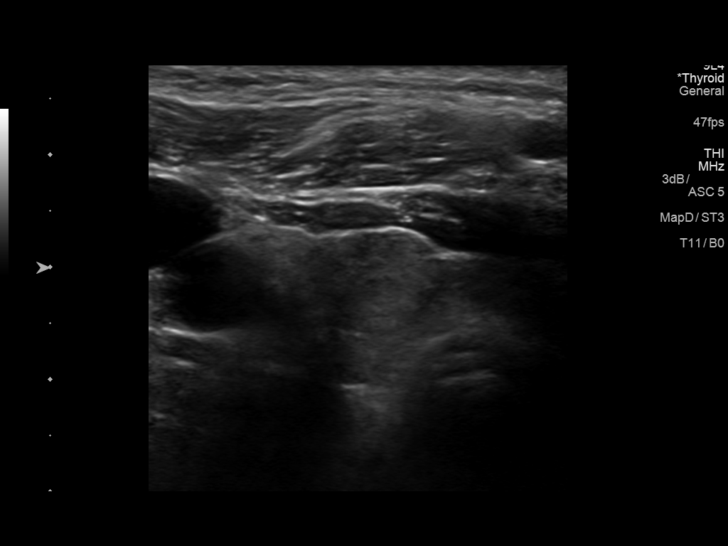
[im 33/57]
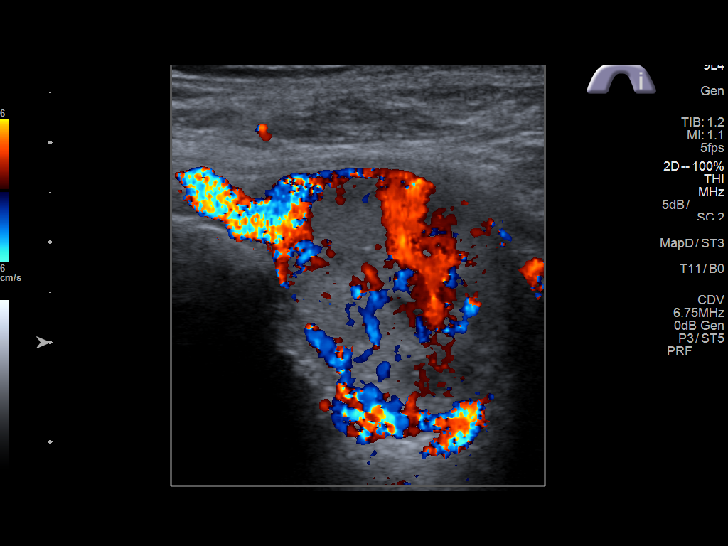
[im 38/57]
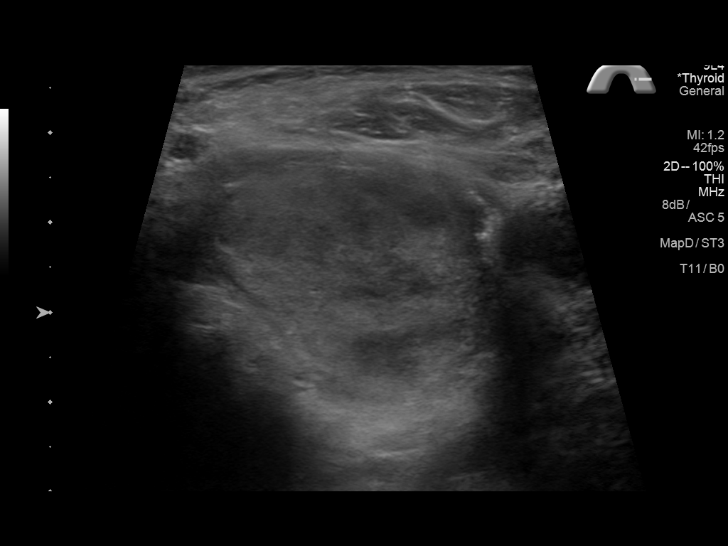
[im 43/57]
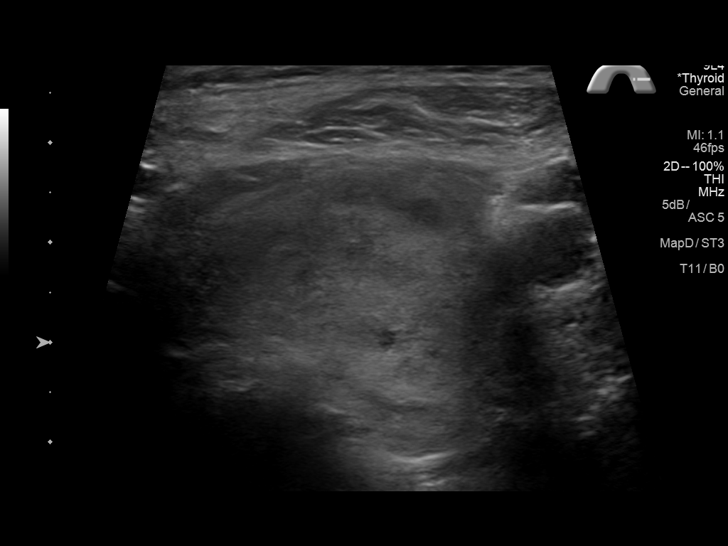
[im 47/57]
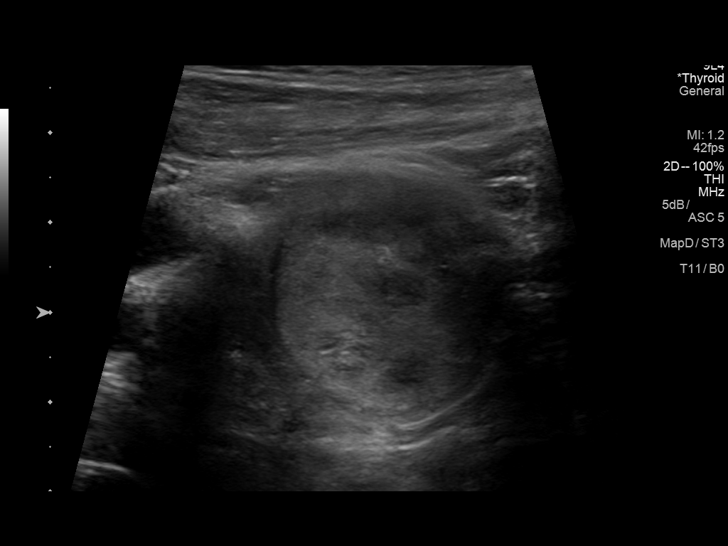
[im 52/57]
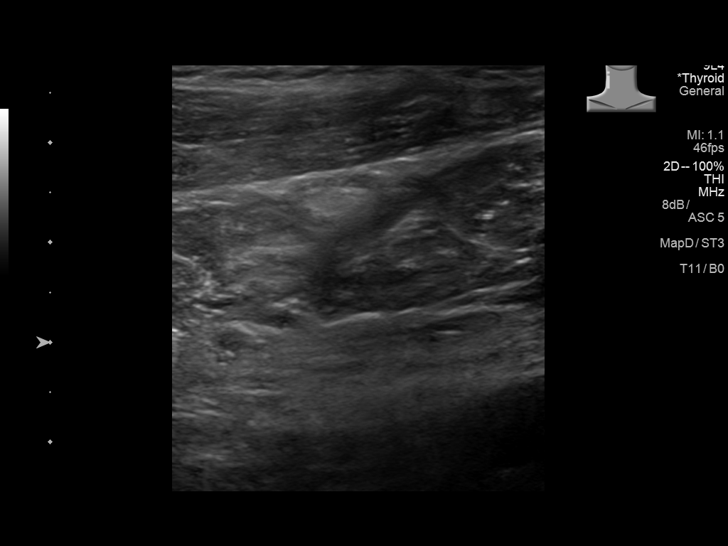
[im 57/57]
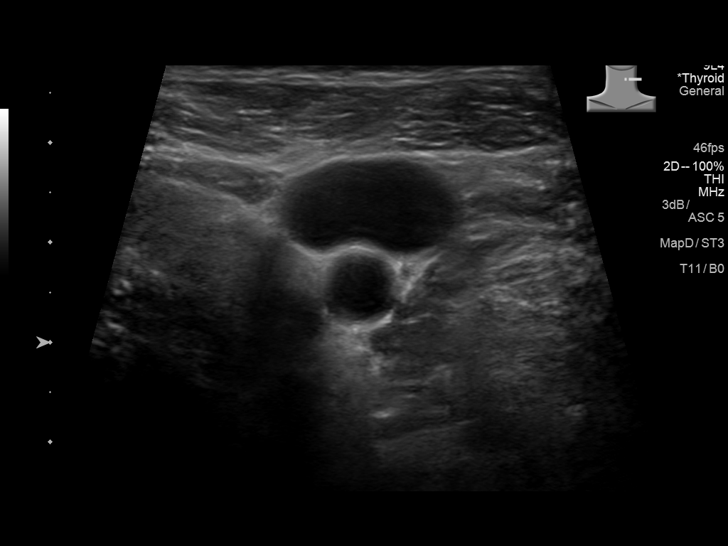

[13 of 25 positions shown; findings below may reference images not displayed]

FINDINGS: Parenchymal Echotexture: Moderately heterogenous

Estimated total number of nodules >/= 1 cm: 3

Number of spongiform nodules >/=  2 cm not described below (TR1): 0

Number of mixed cystic and solid nodules >/= 1.5 cm not described
below (TR2): 0

_________________________________________________________

Isthmus: 5 mm

No discrete nodules are identified within the thyroid isthmus.

_________________________________________________________

Right lobe: 3.1 x 1.0 x 1.4 cm

Nodule # 1:

Location: Right; Superior

Size: 10 x 8 x 7 mm

Composition: solid/almost completely solid (2)

Echogenicity: hypoechoic (2)

Shape: not taller-than-wide (0)

Margins: ill-defined (0)

Echogenic foci: none (0)

ACR TI-RADS total points: 4.

ACR TI-RADS risk category: TR4 (4-6 points).

ACR TI-RADS recommendations:

*Given size (>/= 1 - 1.4 cm) and appearance, a follow-up ultrasound
in 1 year should be considered based on TI-RADS criteria.

Nodule # 2:

Location: Right; Mid

Size: 13 x 10 x 9 mm

Composition: solid/almost completely solid (2)

Echogenicity: isoechoic (1)

Shape: not taller-than-wide (0)

Margins: ill-defined (0)

Echogenic foci: none (0)

ACR TI-RADS total points: 3.

ACR TI-RADS risk category: TR3 (3 points).

ACR TI-RADS recommendations:

Given size (<1.4 cm) and appearance, this nodule does NOT meet
TI-RADS criteria for biopsy or dedicated follow-up.

_________________________________________________________

Left lobe: 3.2 x 2.6 x 3.6 cm

Nodule # 3:

Location: Left; Inferior

Size: 2.7 x 2.3 x 3.2 cm

Composition: solid/almost completely solid (2)

Echogenicity: isoechoic (1)

Shape: not taller-than-wide (0)

Margins: ill-defined (0)

Echogenic foci: none (0)

ACR TI-RADS total points: 3.

ACR TI-RADS risk category: TR3 (3 points).

ACR TI-RADS recommendations:

**Given size (>/= 2.5 cm) and appearance, fine needle aspiration of
this mildly suspicious nodule should be considered based on TI-RADS
criteria.
IMPRESSION: 2.7 cm left inferior solid thyroid nodule (TR 3), meets criteria for
biopsy as above.

Additional right thyroid nodules as above.

The above is in keeping with the ACR TI-RADS recommendations - [HOSPITAL] 1721;[DATE].

## 2018-12-22 ENCOUNTER — Other Ambulatory Visit: Payer: Self-pay

## 2018-12-22 ENCOUNTER — Emergency Department: Payer: Medicare Other

## 2018-12-22 ENCOUNTER — Emergency Department
Admission: EM | Admit: 2018-12-22 | Discharge: 2018-12-22 | Disposition: A | Discharge status: Home / Self Care | Payer: Medicare Other | Attending: Emergency Medicine | Admitting: Emergency Medicine

## 2018-12-22 DIAGNOSIS — K219 Gastro-esophageal reflux disease without esophagitis: Secondary | ICD-10-CM | POA: Diagnosis not present

## 2018-12-22 DIAGNOSIS — K029 Dental caries, unspecified: Secondary | ICD-10-CM | POA: Insufficient documentation

## 2018-12-22 DIAGNOSIS — G8929 Other chronic pain: Secondary | ICD-10-CM | POA: Diagnosis not present

## 2018-12-22 DIAGNOSIS — R0602 Shortness of breath: Secondary | ICD-10-CM | POA: Diagnosis not present

## 2018-12-22 DIAGNOSIS — R0989 Other specified symptoms and signs involving the circulatory and respiratory systems: Secondary | ICD-10-CM | POA: Diagnosis not present

## 2018-12-22 DIAGNOSIS — R079 Chest pain, unspecified: Secondary | ICD-10-CM | POA: Diagnosis not present

## 2018-12-22 DIAGNOSIS — M549 Dorsalgia, unspecified: Secondary | ICD-10-CM | POA: Insufficient documentation

## 2018-12-22 DIAGNOSIS — Z79899 Other long term (current) drug therapy: Secondary | ICD-10-CM | POA: Diagnosis not present

## 2018-12-22 DIAGNOSIS — R51 Headache: Secondary | ICD-10-CM | POA: Diagnosis present

## 2018-12-22 LAB — CBC WITH DIFFERENTIAL/PLATELET
Abs Immature Granulocytes: 0.07 10*3/uL (ref 0.00–0.07)
Basophils Absolute: 0 10*3/uL (ref 0.0–0.1)
Basophils Relative: 0 %
EOS ABS: 0 10*3/uL (ref 0.0–0.5)
EOS PCT: 0 %
HCT: 42.6 % (ref 39.0–52.0)
HEMOGLOBIN: 14.3 g/dL (ref 13.0–17.0)
Immature Granulocytes: 1 %
LYMPHS PCT: 11 %
Lymphs Abs: 0.9 10*3/uL (ref 0.7–4.0)
MCH: 31.2 pg (ref 26.0–34.0)
MCHC: 33.6 g/dL (ref 30.0–36.0)
MCV: 93 fL (ref 80.0–100.0)
MONO ABS: 2.8 10*3/uL — AB (ref 0.1–1.0)
Monocytes Relative: 34 %
Neutro Abs: 4.5 10*3/uL (ref 1.7–7.7)
Neutrophils Relative %: 54 %
Platelets: 134 10*3/uL — ABNORMAL LOW (ref 150–400)
RBC: 4.58 MIL/uL (ref 4.22–5.81)
RDW: 12.5 % (ref 11.5–15.5)
WBC: 8.4 10*3/uL (ref 4.0–10.5)
nRBC: 0 % (ref 0.0–0.2)

## 2018-12-22 LAB — BASIC METABOLIC PANEL
Anion gap: 9 (ref 5–15)
BUN: 18 mg/dL (ref 8–23)
CHLORIDE: 102 mmol/L (ref 98–111)
CO2: 26 mmol/L (ref 22–32)
CREATININE: 1.2 mg/dL (ref 0.61–1.24)
Calcium: 9.2 mg/dL (ref 8.9–10.3)
GFR calc Af Amer: >60 mL/min (ref >60)
GFR calc non Af Amer: 56 mL/min — ABNORMAL LOW (ref >60)
Glucose, Bld: 108 mg/dL — ABNORMAL HIGH (ref 70–99)
Potassium: 3.9 mmol/L (ref 3.5–5.1)
Sodium: 137 mmol/L (ref 135–145)

## 2018-12-22 LAB — TROPONIN I

## 2018-12-22 MED ORDER — FAMOTIDINE 20 MG PO TABS: 20.0000 mg | ORAL_TABLET | Freq: Two times a day (BID) | ORAL | 0 refills | Status: AC

## 2018-12-22 MED ORDER — PENICILLIN V POTASSIUM 250 MG PO TABS: 500.0000 mg | ORAL_TABLET | Freq: Once | ORAL | Status: DC

## 2018-12-22 MED ORDER — PENICILLIN V POTASSIUM 500 MG PO TABS: 500.0000 mg | ORAL_TABLET | Freq: Four times a day (QID) | ORAL | 0 refills | Status: AC

## 2018-12-22 NOTE — ED Triage Notes (Signed)
Pt arrived via POV c/o of facial pain and a headache. Pt states that the left side of his nose, mouth, and chest is hurting and he can't sleep. Pt has been congested and taking pain killers for tooth pain. Pt states that he thinks the associated chest pain is anxiousness.

## 2018-12-22 NOTE — ED Provider Notes (Signed)
[facility] Emergency Department Provider Note  ____________________________________________   First MD Initiated Contact with Patient 12/22/18 1506     (approximate)  I have reviewed the triage vital signs and the nursing notes.   HISTORY  Chief Complaint Facial Pain   HPI Walter Marshall is a 85 y.o. male without any chronic medical conditions who is presenting to the emergency department today complaining of mouth and left-sided facial pain as well as chest pain and back pain.  He says that his back pain is been ongoing for a year and is worse with movement.  Says that his chest pain is intermittent and last for several minutes after eating and feels like a burning/chemical sensation.  However, he has presented to the emergency department today for his primary complaint of left-sided facial pain which he describes over the left anterior cheek just lateral to the left side of the nose as well as to his left frontal teeth.  He says the pain worsens when he bends over and feels like "blood will come out of my nose."  Denies any chest pain at this time.  Reports intermittent shortness of breath that is not associated with chest pain.  Denies any shortness of breath at this time as well.  He said that he was recommended to come to the emergency department for further evaluation by a friend who works at the hospital.  Also had an episode 3 days ago where he says his mouth became very dry.  Interpreter,Hiram, present throughout.  Past Medical History:  Diagnosis Date  . Patient denies medical problems     Patient Active Problem List   Diagnosis Date Noted  . Chest pain 10/13/2016  . SOB (shortness of breath)     Past Surgical History:  Procedure Laterality Date  . NO PAST SURGERIES      Prior to Admission medications   Medication Sig Start Date End Date Taking? Authorizing Provider  cefUROXime (CEFTIN) 500 MG tablet Take 1 tablet (500 mg total) by  mouth 2 (two) times daily with a meal. Patient not taking: Reported on 11/05/2016 10/14/16   Adrian Saran, MD  guaiFENesin-dextromethorphan (ROBITUSSIN DM) 100-10 MG/5ML syrup Take 5 mLs by mouth every 4 (four) hours as needed for cough. 10/14/16   Adrian Saran, MD  pantoprazole (PROTONIX) 40 MG tablet Take 1 tablet (40 mg total) by mouth 2 (two) times daily. 10/14/16   Adrian Saran, MD  predniSONE (DELTASONE) 10 MG tablet Take 1 tablet (10 mg total) by mouth daily with breakfast. 60 mg PO (ORAL) x 2 days 50 mg PO (ORAL)  x 2 days 40 mg PO (ORAL)  x 2 days 30 mg PO  (ORAL)  x 2 days 20 mg PO  (ORAL) x 2 days 10 mg PO  (ORAL) x 2 days then stop Patient not taking: Reported on 11/05/2016 10/14/16   Adrian Saran, MD  THYROID PO Take 1 tablet by mouth daily.    [provider]    Allergies Patient has no known allergies.  Family History  Family history unknown: Yes    Social History Social History   Tobacco Use  . Smoking status: Never Smoker  . Smokeless tobacco: Never Used  Substance Use Topics  . Alcohol use: Yes  . Drug use: No    Review of Systems  Constitutional: No fever/chills Eyes: No visual changes. ENT: No sore throat. Cardiovascular: As above Respiratory: As above Gastrointestinal: No abdominal pain.  No nausea, no  vomiting.  No diarrhea.  No constipation. Genitourinary: Negative for dysuria. Musculoskeletal: Negative for back pain. Skin: Negative for rash. Neurological: Negative for headaches, focal weakness or numbness.   ____________________________________________   PHYSICAL EXAM:  VITAL SIGNS: ED Triage Vitals  Enc Vitals Group     BP 12/22/18 1317 (!) 145/62     Pulse Rate 12/22/18 1317 78     Resp 12/22/18 1317 16     Temp 12/22/18 1317 98 F (36.7 C)     Temp Source 12/22/18 1317 Oral     SpO2 12/22/18 1317 99 %     Weight 12/22/18 1004 140 lb (63.5 kg)     Height 12/22/18 1004 5' (1.524 m)     Head Circumference --      Peak Flow --        Pain Score 12/22/18 1004 10     Pain Loc --      Pain Edu? --      Excl. in GC? --     Constitutional: Alert and oriented. Well appearing and in no acute distress. Eyes: Conjunctivae are normal.  Head: Atraumatic. Nose: No congestion/rhinnorhea.  No blood visualized in the nares. Mouth/Throat: Mucous membranes are moist.  Very poor dentition with multiple areas of dental erosion especially along the gumline.  Also tender to palpation along the anterior and left maxilla without any fluctuance or swelling. Neck: No stridor.   Cardiovascular: Normal rate, regular rhythm. Grossly normal heart sounds.  Good peripheral circulation with equal and bilateral radial as well as dorsalis pedis pulses. Respiratory: Normal respiratory effort.  No retractions. Lungs CTAB. Gastrointestinal: Soft and nontender. No distention.  Musculoskeletal: No lower extremity tenderness nor edema.  No joint effusions.  No tenderness to the palpation to the thoracic or lumbar spines.  Patient says that the back feels better when palpated. Neurologic:  Normal speech and language. No gross focal neurologic deficits are appreciated. Skin:  Skin is warm, dry and intact. No rash noted. Psychiatric: Mood and affect are normal. Speech and behavior are normal.  ____________________________________________   LABS (all labs ordered are listed, but only abnormal results are displayed)  Labs Reviewed  CBC WITH DIFFERENTIAL/PLATELET - Abnormal; Notable for the following components:      Result Value   Platelets 134 (*)    Monocytes Absolute 2.8 (*)    All other components within normal limits  BASIC METABOLIC PANEL - Abnormal; Notable for the following components:   Glucose, Bld 108 (*)    GFR calc non Af Amer 56 (*)    All other components within normal limits  TROPONIN I   ____________________________________________  EKG  ED ECG REPORT I, Arelia Longestavid M Brittnay Pigman, the attending physician, personally viewed and  interpreted this ECG.   Date: 12/22/2018  EKG Time: 1012  Rate: 90  Rhythm: normal sinus rhythm  Axis: Normal  Intervals:none  ST&T Change: No ST segment elevation or depression.  No abnormal T wave inversion.  ____________________________________________  RADIOLOGY  Chest x-ray without acute disease. ____________________________________________   PROCEDURES  Procedure(s) performed:   Procedures  Critical Care performed:   ____________________________________________   INITIAL IMPRESSION / ASSESSMENT AND PLAN / ED COURSE  Pertinent labs & imaging results that were available during my care of the patient were reviewed by me and considered in my medical decision making (see chart for details).  DDX: Dental caries, dental pain, dental abscess, musculoskeletal back pain, GERD, ACS, uncontrolled hypertension Differential diagnosis includes, but is not limited to, ACS,  aortic dissection, pulmonary embolism, cardiac tamponade, pneumothorax, pneumonia, pericarditis, myocarditis, GI-related causes including esophagitis/gastritis, and musculoskeletal chest wall pain.   As part of my medical decision making, I reviewed the following data within the electronic MEDICAL RECORD NUMBER Notes from prior ED visits  Patient with very benign appearance at this time.  Back pain appears musculoskeletal.  Chest pain most consistent with GERD.  Patient denies smoking history.  Denies drinking.  Denies any family history of cardiac disease.  ----------------------------------------- 7:08 PM on 12/22/2018 -----------------------------------------  Patient at this time continues without complaints.  Very reassuring work-up.  Normal EKG.  Negative troponin after multiple days of symptoms.  Patient also stating now that he has difficulty eating spicy things because of the same chest pain.  Will be discharged with penicillin VK for the pain that is likely related to his teeth as well as Pepcid.  To  follow-up with dental as well as primary care.  He is understanding of the diagnosis well treatment plan willing to comply. ____________________________________________   FINAL CLINICAL IMPRESSION(S) / ED DIAGNOSES  GERD.  Dental pain.  Back pain.    NEW MEDICATIONS STARTED DURING THIS VISIT:  New Prescriptions   No medications on file     Note:  This document was prepared using Dragon voice recognition software and may include unintentional dictation errors.     Myrna BlazerSchaevitz, Damarcus Reggio Matthew, MD 12/22/18 [phone]

## 2020-05-28 IMAGING — CR DG CHEST 2V
1 series · 2 of 2 positions shown · non-contrast
Comparison: 10/12/2016

CLINICAL DATA: Facial pain, headache, congestion, chest pain,
nonsmoker

EXAM:
CHEST - 2 VIEW

[Series 1: w chest pa · 0.14mm/px · 2 of 2 slices shown]
[im 1/2]
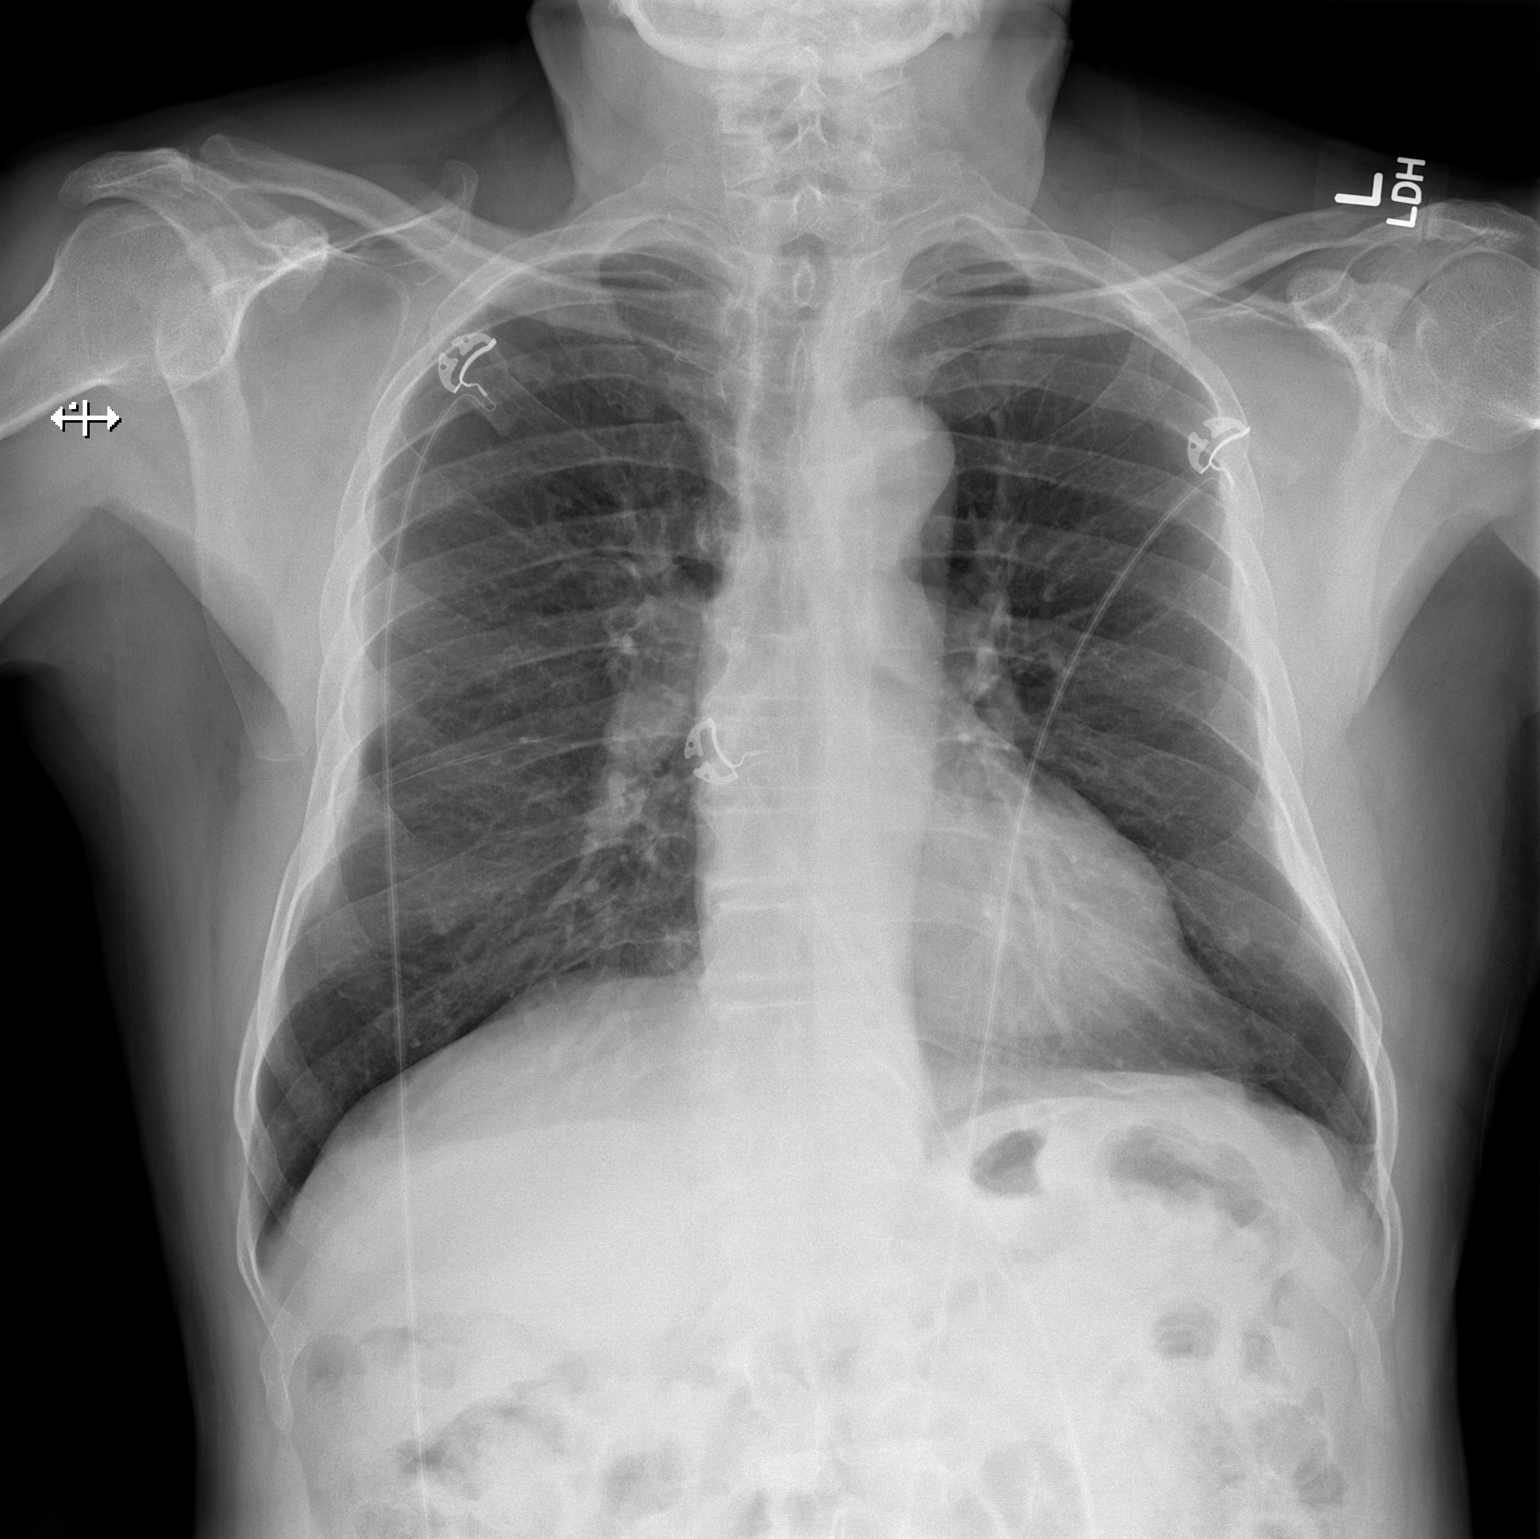
[im 2/2]
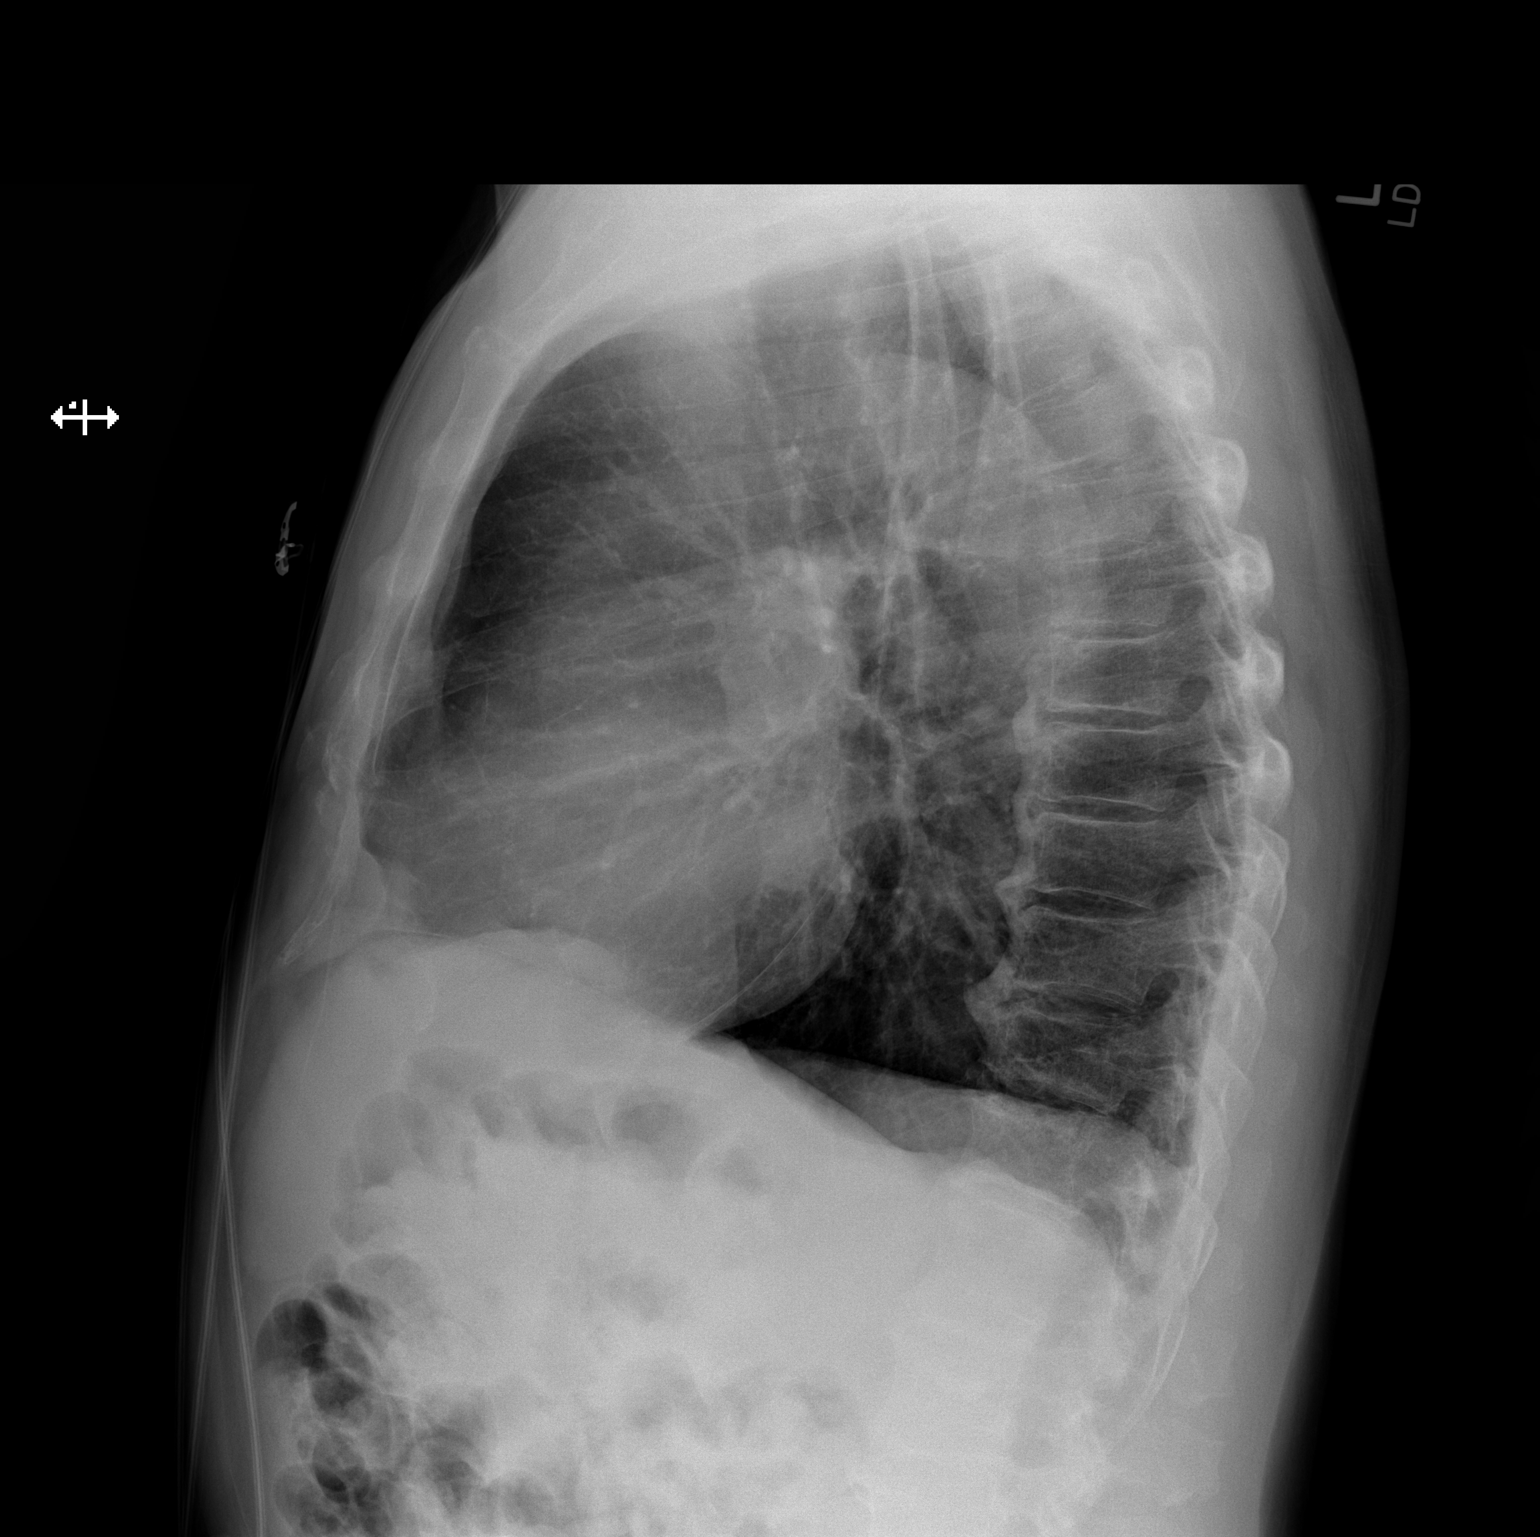

[2 of 2 positions shown; findings below may reference images not displayed]

FINDINGS: Normal heart size, mediastinal contours, and pulmonary vascularity.

Lungs clear.

No pulmonary infiltrate, pleural effusion or pneumothorax.

Question BILATERAL nipple shadows.

Scattered endplate spur formation thoracic spine.

No acute osseous findings.
IMPRESSION: Question BILATERAL nipple shadow; repeat PA chest radiograph with
nipple markers recommended to exclude pulmonary nodules.
